# Patient Record
Sex: Female | Born: 1935
Health system: Southern US, Community
[De-identification: ages and names within clinical notes are randomized; demographics above are authoritative.]

## PROBLEM LIST (undated history)

## (undated) DIAGNOSIS — F0390 Unspecified dementia without behavioral disturbance: Secondary | ICD-10-CM

## (undated) DIAGNOSIS — M1990 Unspecified osteoarthritis, unspecified site: Secondary | ICD-10-CM

---

## 1998-11-24 ENCOUNTER — Other Ambulatory Visit: Admission: RE | Admit: 1998-11-24 | Discharge: 1998-11-24 | Payer: Self-pay | Admitting: Gastroenterology

## 1999-07-31 ENCOUNTER — Encounter: Payer: Self-pay | Admitting: Family Medicine

## 1999-07-31 ENCOUNTER — Encounter: Admission: RE | Admit: 1999-07-31 | Discharge: 1999-07-31 | Payer: Self-pay | Admitting: Family Medicine

## 2000-01-02 ENCOUNTER — Encounter: Payer: Self-pay | Admitting: Sports Medicine

## 2000-01-02 ENCOUNTER — Encounter: Admission: RE | Admit: 2000-01-02 | Discharge: 2000-01-02 | Payer: Self-pay | Admitting: Sports Medicine

## 2000-01-09 ENCOUNTER — Other Ambulatory Visit: Admission: RE | Admit: 2000-01-09 | Discharge: 2000-01-09 | Payer: Self-pay | Admitting: Family Medicine

## 2000-01-16 ENCOUNTER — Encounter: Payer: Self-pay | Admitting: Family Medicine

## 2000-01-16 ENCOUNTER — Encounter: Admission: RE | Admit: 2000-01-16 | Discharge: 2000-01-16 | Payer: Self-pay | Admitting: Family Medicine

## 2001-01-02 ENCOUNTER — Encounter: Admission: RE | Admit: 2001-01-02 | Discharge: 2001-01-02 | Payer: Self-pay | Admitting: Family Medicine

## 2001-01-02 ENCOUNTER — Encounter: Payer: Self-pay | Admitting: Family Medicine

## 2001-12-31 ENCOUNTER — Encounter: Payer: Self-pay | Admitting: Family Medicine

## 2001-12-31 ENCOUNTER — Encounter: Admission: RE | Admit: 2001-12-31 | Discharge: 2001-12-31 | Payer: Self-pay | Admitting: Family Medicine

## 2002-11-26 ENCOUNTER — Ambulatory Visit (HOSPITAL_COMMUNITY): Admission: RE | Admit: 2002-11-26 | Discharge: 2002-11-26 | Payer: Self-pay | Admitting: Gastroenterology

## 2002-11-26 ENCOUNTER — Encounter: Payer: Self-pay | Admitting: Gastroenterology

## 2003-01-01 ENCOUNTER — Encounter: Admission: RE | Admit: 2003-01-01 | Discharge: 2003-01-01 | Payer: Self-pay | Admitting: Family Medicine

## 2003-01-01 ENCOUNTER — Encounter: Payer: Self-pay | Admitting: Family Medicine

## 2003-02-19 ENCOUNTER — Ambulatory Visit (HOSPITAL_COMMUNITY): Admission: RE | Admit: 2003-02-19 | Discharge: 2003-02-19 | Payer: Self-pay | Admitting: Gastroenterology

## 2003-02-19 ENCOUNTER — Encounter: Payer: Self-pay | Admitting: Gastroenterology

## 2004-01-06 ENCOUNTER — Encounter: Admission: RE | Admit: 2004-01-06 | Discharge: 2004-01-06 | Payer: Self-pay | Admitting: Family Medicine

## 2004-07-13 ENCOUNTER — Encounter: Admission: RE | Admit: 2004-07-13 | Discharge: 2004-07-13 | Payer: Self-pay | Admitting: Family Medicine

## 2005-01-08 ENCOUNTER — Encounter: Admission: RE | Admit: 2005-01-08 | Discharge: 2005-01-08 | Payer: Self-pay | Admitting: Family Medicine

## 2005-05-08 ENCOUNTER — Encounter: Admission: RE | Admit: 2005-05-08 | Discharge: 2005-05-08 | Payer: Self-pay | Admitting: Family Medicine

## 2005-06-18 ENCOUNTER — Encounter: Admission: RE | Admit: 2005-06-18 | Discharge: 2005-06-18 | Payer: Self-pay | Admitting: Otolaryngology

## 2006-01-16 ENCOUNTER — Encounter: Admission: RE | Admit: 2006-01-16 | Discharge: 2006-01-16 | Payer: Self-pay | Admitting: Family Medicine

## 2006-07-09 ENCOUNTER — Encounter: Admission: RE | Admit: 2006-07-09 | Discharge: 2006-07-09 | Payer: Self-pay | Admitting: Family Medicine

## 2007-01-20 ENCOUNTER — Encounter: Admission: RE | Admit: 2007-01-20 | Discharge: 2007-01-20 | Payer: Self-pay | Admitting: Family Medicine

## 2007-06-25 ENCOUNTER — Encounter: Admission: RE | Admit: 2007-06-25 | Discharge: 2007-06-25 | Payer: Self-pay | Admitting: Family Medicine

## 2008-02-10 ENCOUNTER — Encounter: Admission: RE | Admit: 2008-02-10 | Discharge: 2008-02-10 | Payer: Self-pay | Admitting: Family Medicine

## 2008-03-22 ENCOUNTER — Encounter: Admission: RE | Admit: 2008-03-22 | Discharge: 2008-03-22 | Payer: Self-pay | Admitting: Otolaryngology

## 2009-02-10 ENCOUNTER — Encounter: Admission: RE | Admit: 2009-02-10 | Discharge: 2009-02-10 | Payer: Self-pay | Admitting: Family Medicine

## 2009-06-28 ENCOUNTER — Encounter (INDEPENDENT_AMBULATORY_CARE_PROVIDER_SITE_OTHER): Payer: Self-pay | Admitting: *Deleted

## 2009-07-26 ENCOUNTER — Encounter: Admission: RE | Admit: 2009-07-26 | Discharge: 2009-07-26 | Payer: Self-pay | Admitting: Family Medicine

## 2010-02-24 ENCOUNTER — Telehealth: Payer: Self-pay | Admitting: Internal Medicine

## 2010-05-16 ENCOUNTER — Telehealth: Payer: Self-pay | Admitting: Internal Medicine

## 2010-06-22 ENCOUNTER — Encounter (INDEPENDENT_AMBULATORY_CARE_PROVIDER_SITE_OTHER): Payer: Self-pay | Admitting: *Deleted

## 2010-06-26 ENCOUNTER — Ambulatory Visit: Payer: Self-pay | Admitting: Internal Medicine

## 2010-07-11 ENCOUNTER — Ambulatory Visit: Payer: Self-pay | Admitting: Internal Medicine

## 2010-10-29 ENCOUNTER — Encounter: Payer: Self-pay | Admitting: Family Medicine

## 2010-10-30 ENCOUNTER — Encounter: Payer: Self-pay | Admitting: Family Medicine

## 2010-11-09 NOTE — Procedures (Signed)
Summary: Colonoscopy  Patient: Brandi Chang Note: All result statuses are Final unless otherwise noted.  Tests: (1) Colonoscopy (COL)   COL Colonoscopy           DONE     [facility]     [address]     [place], [place]  [address]           COLONOSCOPY PROCEDURE REPORT           PATIENT:  Brandi Chang, Brandi Chang  MR#:  [id number]     BIRTHDATE:  1930/04/01, 79 yrs. old  GENDER:  female     ENDOSCOPIST:  Hedwig Morton. Juanda Chance, MD     REF. BY:  Burnell Blanks, M.D.     PROCEDURE DATE:  07/11/2010     PROCEDURE:  Colonoscopy 19147     ASA CLASS:  Class II     INDICATIONS:  Elevated Risk Screening hx colon polyps (?), no     documentation     last colon 2005 no polyps     MEDICATIONS:   Versed 8 mg, Fentanyl 62.5 mcg           DESCRIPTION OF PROCEDURE:   After the risks benefits and     alternatives of the procedure were thoroughly explained, informed     consent was obtained.  Digital rectal exam was performed and     revealed no rectal masses.   The LB PCF-H180AL [id number] endoscope     was introduced through the anus and advanced to the cecum, which     was identified by both the appendix and ileocecal valve, without     limitations.  The quality of the prep was good, using MiraLax.     The instrument was then slowly withdrawn as the colon was fully     examined.     <<PROCEDUREIMAGES>>           FINDINGS:  No polyps or cancers were seen (see image1, image2,     image3, and image4).   Retroflexed views in the rectum revealed no     abnormalities.    The scope was then withdrawn from the patient     and the procedure completed.           COMPLICATIONS:  None     ENDOSCOPIC IMPRESSION:     1) No polyps or cancers     2) Normal colonoscopy     RECOMMENDATIONS:     1) high fiber diet     REPEAT EXAM:  In 0 year(s) for.  no recall due to age           ______________________________     Hedwig Morton. Juanda Chance, MD           CC:           n.     eSIGNED:   Hedwig Morton. Tristyn Pharris at  07/11/2010 10:23 AM           Lucretia Roers, Marise, [phone]  Note: An exclamation mark (!) indicates a result that was not dispersed into the flowsheet. Document Creation Date: 07/11/2010 10:24 AM _______________________________________________________________________  (1) Order result status: Final Collection or observation date-time: 07/11/2010 10:11 Requested date-time:  Receipt date-time:  Reported date-time:  Referring Physician:   Ordering Physician: Lina Sar [phone]) Specimen Source:  Source: Launa Grill Order Number: [id number] Lab site:

## 2010-11-09 NOTE — Progress Notes (Signed)
Summary: mucus in stool  Phone Note Call from Patient Call back at Home Phone [phone]   Caller: Patient Call For: Dr. Juanda Chance Reason for Call: Talk to Nurse Summary of Call: pt reporting mucus in stool and is afraid to wait until next available appt in October... wants to know if that would be ok Initial call taken by: Vallarie Mare,  May 16, 2010 10:28 AM  Follow-up for Phone Call        Pt asking to sch colon.  Stated she was called recently and notified that it was time to sch colon.  She is ready to sch, has noticed some mucus in her stool, no other symptoms.  Colon and previsit sch.  Pt instructed to call if bleeding or other symptoms develop. Follow-up by: Ashok Cordia RN,  May 16, 2010 12:09 PM

## 2010-11-09 NOTE — Letter (Signed)
Summary: [facility] Instructions  [facility]  [address] [place], [place] [address]   Phone: [phone]  Fax: [phone]       Brandi Chang    1930/04/14    MRN: [id number]       Procedure Day /Date:  Tuesday 07/11/2010     Arrival Time: 8:30 am     Procedure Time: 9:30 am     Location of Procedure:                    _ x_  [facility] (4th Floor)   PREPARATION FOR COLONOSCOPY WITH MIRALAX  Starting 5 days prior to your procedure Thursday 9/29 do not eat nuts, seeds, popcorn, corn, beans, peas,  salads, or any raw vegetables.  Do not take any fiber supplements (e.g. Metamucil, Citrucel, and Benefiber). ____________________________________________________________________________________________________   THE DAY BEFORE YOUR PROCEDURE         DATE: Monday 10/3  1   Drink clear liquids the entire day-NO SOLID FOOD  2   Do not drink anything colored red or purple.  Avoid juices with pulp.  No orange juice.  3   Drink at least 64 oz. (8 glasses) of fluid/clear liquids during the day to prevent dehydration and help the prep work efficiently.  CLEAR LIQUIDS INCLUDE: Water Jello Ice Popsicles Tea (sugar ok, no milk/cream) Powdered fruit flavored drinks Coffee (sugar ok, no milk/cream) Gatorade Juice: apple, white grape, white cranberry  Lemonade Clear bullion, consomm, broth Carbonated beverages (any kind) Strained chicken noodle soup Hard Candy  4   Mix the entire bottle of Miralax with 64 oz. of Gatorade/Powerade in the morning and put in the refrigerator to chill.  5   At 3:00 pm take 2 Dulcolax/Bisacodyl tablets.  6   At 4:30 pm take one Reglan/Metoclopramide tablet.  7  Starting at 5:00 pm drink one 8 oz glass of the Miralax mixture every 15-20 minutes until you have finished drinking the entire 64 oz.  You should finish drinking prep around 7:30 or 8:00 pm.  8   If you are nauseated, you may take the 2nd Reglan/Metoclopramide  tablet at 6:30 pm.        9    At 8:00 pm take 2 more DULCOLAX/Bisacodyl tablets.     THE DAY OF YOUR PROCEDURE      DATE:  Tuesday 10/4  You may drink clear liquids until 7:30 am (2 HOURS BEFORE PROCEDURE).   MEDICATION INSTRUCTIONS  Unless otherwise instructed, you should take regular prescription medications with a small sip of water as early as possible the morning of your procedure.    Additional medication instructions: n/a         OTHER INSTRUCTIONS  You will need a responsible adult at least 75 years of age to accompany you and drive you home.   This person must remain in the waiting room during your procedure.  Wear loose fitting clothing that is easily removed.  Leave jewelry and other valuables at home.  However, you may wish to bring a book to read or an iPod/MP3 player to listen to music as you wait for your procedure to start.  Remove all body piercing jewelry and leave at home.  Total time from sign-in until discharge is approximately 2-3 hours.  You should go home directly after your procedure and rest.  You can resume normal activities the day after your procedure.  The day of your procedure you should not:  Drive   Make legal decisions   Operate machinery   Drink alcohol   Return to work  You will receive specific instructions about eating, activities and medications before you leave.   The above instructions have been reviewed and explained to me by   Sherren Kerns RN  June 26, 2010 8:15 AM     I fully understand and can verbalize these instructions _____________________________ Date _______

## 2010-11-09 NOTE — Miscellaneous (Signed)
Summary: previsit prep/RM  Clinical Lists Changes  Medications: Added new medication of MIRALAX   POWD (POLYETHYLENE GLYCOL 3350) As per prep  instructions. - Signed Added new medication of DULCOLAX 5 MG  TBEC (BISACODYL) Day before procedure take 2 at 3pm and 2 at 8pm. - Signed Added new medication of REGLAN 10 MG  TABS (METOCLOPRAMIDE HCL) As per prep instructions. - Signed Rx of MIRALAX   POWD (POLYETHYLENE GLYCOL 3350) As per prep  instructions.;  #255gm x 0;  Signed;  Entered by: Sherren Kerns RN;  Authorized by: Hart Carwin MD;  Method used: Print then Give to Patient Rx of DULCOLAX 5 MG  TBEC (BISACODYL) Day before procedure take 2 at 3pm and 2 at 8pm.;  #4 x 0;  Signed;  Entered by: Sherren Kerns RN;  Authorized by: Hart Carwin MD;  Method used: Print then Give to Patient Rx of REGLAN 10 MG  TABS (METOCLOPRAMIDE HCL) As per prep instructions.;  #2 x 0;  Signed;  Entered by: Sherren Kerns RN;  Authorized by: Hart Carwin MD;  Method used: Print then Give to Patient    Prescriptions: REGLAN 10 MG  TABS (METOCLOPRAMIDE HCL) As per prep instructions.  #2 x 0   Entered by:   Sherren Kerns RN   Authorized by:   Hart Carwin MD   Signed by:   Sherren Kerns RN on 06/26/2010   Method used:   Print then Give to Patient   RxID:   [id number] DULCOLAX 5 MG  TBEC (BISACODYL) Day before procedure take 2 at 3pm and 2 at 8pm.  #4 x 0   Entered by:   Sherren Kerns RN   Authorized by:   Hart Carwin MD   Signed by:   Sherren Kerns RN on 06/26/2010   Method used:   Print then Give to Patient   RxID:   [id number] MIRALAX   POWD (POLYETHYLENE GLYCOL 3350) As per prep  instructions.  #255gm x 0   Entered by:   Sherren Kerns RN   Authorized by:   Hart Carwin MD   Signed by:   Sherren Kerns RN on 06/26/2010   Method used:   Print then Give to Patient   RxID:   [id number]

## 2010-11-09 NOTE — Progress Notes (Signed)
Summary: Schedule Colonoscopy  Phone Note Outgoing Call Call back at Home Phone [phone]   Call placed by: Harlow Mares CMA Duncan Dull),  Feb 24, 2010 4:45 PM Call placed to: Patient Summary of Call: spoke to the patient she has alot going on now and would like to know if she could not have her colonosocpy, i strongly her that she would need to have her colonoscopy. She has a person hx of adenmatous polyps. i gave her the number and the MD name. She will think about it and call back.  Initial call taken by: Harlow Mares CMA Duncan Dull),  Feb 24, 2010 4:59 PM

## 2011-07-23 ENCOUNTER — Other Ambulatory Visit: Payer: Self-pay | Admitting: Family Medicine

## 2011-07-23 DIAGNOSIS — M8580 Other specified disorders of bone density and structure, unspecified site: Secondary | ICD-10-CM

## 2011-07-23 DIAGNOSIS — Z1231 Encounter for screening mammogram for malignant neoplasm of breast: Secondary | ICD-10-CM

## 2011-08-17 ENCOUNTER — Ambulatory Visit: Payer: Self-pay

## 2011-08-17 ENCOUNTER — Other Ambulatory Visit: Payer: Self-pay

## 2011-08-27 ENCOUNTER — Other Ambulatory Visit: Payer: Self-pay

## 2011-08-27 ENCOUNTER — Ambulatory Visit: Payer: Self-pay

## 2011-08-29 ENCOUNTER — Other Ambulatory Visit: Payer: Self-pay

## 2011-08-29 ENCOUNTER — Ambulatory Visit: Payer: Self-pay

## 2012-03-11 DIAGNOSIS — M159 Polyosteoarthritis, unspecified: Secondary | ICD-10-CM | POA: Diagnosis not present

## 2012-03-11 DIAGNOSIS — M899 Disorder of bone, unspecified: Secondary | ICD-10-CM | POA: Diagnosis not present

## 2012-03-11 DIAGNOSIS — G3184 Mild cognitive impairment, so stated: Secondary | ICD-10-CM | POA: Diagnosis not present

## 2012-03-11 DIAGNOSIS — M949 Disorder of cartilage, unspecified: Secondary | ICD-10-CM | POA: Diagnosis not present

## 2012-04-04 ENCOUNTER — Other Ambulatory Visit: Payer: Self-pay

## 2012-04-04 ENCOUNTER — Ambulatory Visit: Payer: Self-pay

## 2012-04-17 DIAGNOSIS — Z23 Encounter for immunization: Secondary | ICD-10-CM | POA: Diagnosis not present

## 2012-04-23 ENCOUNTER — Ambulatory Visit: Payer: Self-pay

## 2012-04-23 ENCOUNTER — Other Ambulatory Visit: Payer: Self-pay

## 2012-05-23 ENCOUNTER — Ambulatory Visit
Admission: RE | Admit: 2012-05-23 | Discharge: 2012-05-23 | Disposition: A | Discharge status: Home / Self Care | Payer: Medicare Other | Source: Ambulatory Visit | Attending: Family Medicine | Admitting: Family Medicine

## 2012-05-23 ENCOUNTER — Ambulatory Visit: Payer: Self-pay

## 2012-05-23 ENCOUNTER — Ambulatory Visit
Admission: RE | Admit: 2012-05-23 | Discharge: 2012-05-23 | Disposition: A | Discharge status: Home / Self Care | Payer: Self-pay | Source: Ambulatory Visit | Attending: Family Medicine | Admitting: Family Medicine

## 2012-05-23 ENCOUNTER — Other Ambulatory Visit: Payer: Self-pay

## 2012-05-23 DIAGNOSIS — M949 Disorder of cartilage, unspecified: Secondary | ICD-10-CM | POA: Diagnosis not present

## 2012-05-23 DIAGNOSIS — Z780 Asymptomatic menopausal state: Secondary | ICD-10-CM | POA: Diagnosis not present

## 2012-05-23 DIAGNOSIS — Z1231 Encounter for screening mammogram for malignant neoplasm of breast: Secondary | ICD-10-CM | POA: Diagnosis not present

## 2012-05-23 DIAGNOSIS — M8580 Other specified disorders of bone density and structure, unspecified site: Secondary | ICD-10-CM

## 2012-05-23 DIAGNOSIS — M899 Disorder of bone, unspecified: Secondary | ICD-10-CM | POA: Diagnosis not present

## 2012-06-26 DIAGNOSIS — Z23 Encounter for immunization: Secondary | ICD-10-CM | POA: Diagnosis not present

## 2012-06-26 DIAGNOSIS — R413 Other amnesia: Secondary | ICD-10-CM | POA: Diagnosis not present

## 2012-06-26 DIAGNOSIS — M899 Disorder of bone, unspecified: Secondary | ICD-10-CM | POA: Diagnosis not present

## 2012-07-25 DIAGNOSIS — M899 Disorder of bone, unspecified: Secondary | ICD-10-CM | POA: Diagnosis not present

## 2012-07-25 DIAGNOSIS — F0390 Unspecified dementia without behavioral disturbance: Secondary | ICD-10-CM | POA: Diagnosis not present

## 2012-07-25 DIAGNOSIS — M545 Low back pain: Secondary | ICD-10-CM | POA: Diagnosis not present

## 2012-07-25 DIAGNOSIS — IMO0002 Reserved for concepts with insufficient information to code with codable children: Secondary | ICD-10-CM | POA: Diagnosis not present

## 2012-07-28 DIAGNOSIS — H2510 Age-related nuclear cataract, unspecified eye: Secondary | ICD-10-CM | POA: Diagnosis not present

## 2012-08-08 DIAGNOSIS — M47817 Spondylosis without myelopathy or radiculopathy, lumbosacral region: Secondary | ICD-10-CM | POA: Diagnosis not present

## 2012-08-08 DIAGNOSIS — M545 Low back pain, unspecified: Secondary | ICD-10-CM | POA: Diagnosis not present

## 2012-08-21 ENCOUNTER — Ambulatory Visit: Payer: Self-pay | Admitting: Ophthalmology

## 2012-08-21 DIAGNOSIS — H2510 Age-related nuclear cataract, unspecified eye: Secondary | ICD-10-CM | POA: Diagnosis not present

## 2012-08-21 DIAGNOSIS — Z01810 Encounter for preprocedural cardiovascular examination: Secondary | ICD-10-CM | POA: Diagnosis not present

## 2012-08-21 DIAGNOSIS — I10 Essential (primary) hypertension: Secondary | ICD-10-CM | POA: Diagnosis not present

## 2012-09-02 ENCOUNTER — Ambulatory Visit: Payer: Self-pay | Admitting: Ophthalmology

## 2012-09-02 DIAGNOSIS — M129 Arthropathy, unspecified: Secondary | ICD-10-CM | POA: Diagnosis not present

## 2012-09-02 DIAGNOSIS — M810 Age-related osteoporosis without current pathological fracture: Secondary | ICD-10-CM | POA: Diagnosis not present

## 2012-09-02 DIAGNOSIS — K219 Gastro-esophageal reflux disease without esophagitis: Secondary | ICD-10-CM | POA: Diagnosis not present

## 2012-09-02 DIAGNOSIS — H2510 Age-related nuclear cataract, unspecified eye: Secondary | ICD-10-CM | POA: Diagnosis not present

## 2012-09-02 DIAGNOSIS — H269 Unspecified cataract: Secondary | ICD-10-CM | POA: Diagnosis not present

## 2012-09-02 DIAGNOSIS — F329 Major depressive disorder, single episode, unspecified: Secondary | ICD-10-CM | POA: Diagnosis not present

## 2012-09-02 DIAGNOSIS — Z79899 Other long term (current) drug therapy: Secondary | ICD-10-CM | POA: Diagnosis not present

## 2012-09-02 DIAGNOSIS — R413 Other amnesia: Secondary | ICD-10-CM | POA: Diagnosis not present

## 2012-09-12 DIAGNOSIS — F0390 Unspecified dementia without behavioral disturbance: Secondary | ICD-10-CM | POA: Diagnosis not present

## 2012-09-12 DIAGNOSIS — M1990 Unspecified osteoarthritis, unspecified site: Secondary | ICD-10-CM | POA: Diagnosis not present

## 2012-09-12 DIAGNOSIS — M545 Low back pain: Secondary | ICD-10-CM | POA: Diagnosis not present

## 2012-09-15 DIAGNOSIS — Z79899 Other long term (current) drug therapy: Secondary | ICD-10-CM | POA: Diagnosis not present

## 2012-09-15 DIAGNOSIS — E559 Vitamin D deficiency, unspecified: Secondary | ICD-10-CM | POA: Diagnosis not present

## 2013-01-15 DIAGNOSIS — Z1331 Encounter for screening for depression: Secondary | ICD-10-CM | POA: Diagnosis not present

## 2013-01-15 DIAGNOSIS — F0390 Unspecified dementia without behavioral disturbance: Secondary | ICD-10-CM | POA: Diagnosis not present

## 2013-01-15 DIAGNOSIS — E559 Vitamin D deficiency, unspecified: Secondary | ICD-10-CM | POA: Diagnosis not present

## 2013-01-15 DIAGNOSIS — Z79899 Other long term (current) drug therapy: Secondary | ICD-10-CM | POA: Diagnosis not present

## 2013-01-15 DIAGNOSIS — IMO0002 Reserved for concepts with insufficient information to code with codable children: Secondary | ICD-10-CM | POA: Diagnosis not present

## 2013-01-15 DIAGNOSIS — Z9181 History of falling: Secondary | ICD-10-CM | POA: Diagnosis not present

## 2013-02-11 DIAGNOSIS — M6740 Ganglion, unspecified site: Secondary | ICD-10-CM | POA: Diagnosis not present

## 2013-02-11 DIAGNOSIS — IMO0002 Reserved for concepts with insufficient information to code with codable children: Secondary | ICD-10-CM | POA: Diagnosis not present

## 2013-02-11 DIAGNOSIS — Z9181 History of falling: Secondary | ICD-10-CM | POA: Diagnosis not present

## 2013-04-08 DIAGNOSIS — L723 Sebaceous cyst: Secondary | ICD-10-CM | POA: Diagnosis not present

## 2013-04-08 DIAGNOSIS — M7130 Other bursal cyst, unspecified site: Secondary | ICD-10-CM | POA: Diagnosis not present

## 2013-06-24 DIAGNOSIS — H353190 Nonexudative age-related macular degeneration, unspecified eye, stage unspecified: Secondary | ICD-10-CM | POA: Diagnosis not present

## 2013-07-21 ENCOUNTER — Other Ambulatory Visit: Payer: Self-pay

## 2013-07-21 DIAGNOSIS — Z1231 Encounter for screening mammogram for malignant neoplasm of breast: Secondary | ICD-10-CM

## 2013-07-23 DIAGNOSIS — E559 Vitamin D deficiency, unspecified: Secondary | ICD-10-CM | POA: Diagnosis not present

## 2013-07-23 DIAGNOSIS — Z23 Encounter for immunization: Secondary | ICD-10-CM | POA: Diagnosis not present

## 2013-07-23 DIAGNOSIS — M899 Disorder of bone, unspecified: Secondary | ICD-10-CM | POA: Diagnosis not present

## 2013-07-23 DIAGNOSIS — Z79899 Other long term (current) drug therapy: Secondary | ICD-10-CM | POA: Diagnosis not present

## 2013-07-23 DIAGNOSIS — M159 Polyosteoarthritis, unspecified: Secondary | ICD-10-CM | POA: Diagnosis not present

## 2013-07-24 DIAGNOSIS — H25049 Posterior subcapsular polar age-related cataract, unspecified eye: Secondary | ICD-10-CM | POA: Diagnosis not present

## 2013-08-06 ENCOUNTER — Ambulatory Visit: Payer: Self-pay | Admitting: Ophthalmology

## 2013-08-06 DIAGNOSIS — H2510 Age-related nuclear cataract, unspecified eye: Secondary | ICD-10-CM | POA: Diagnosis not present

## 2013-08-06 DIAGNOSIS — I10 Essential (primary) hypertension: Secondary | ICD-10-CM | POA: Diagnosis not present

## 2013-08-06 DIAGNOSIS — Z01810 Encounter for preprocedural cardiovascular examination: Secondary | ICD-10-CM | POA: Diagnosis not present

## 2013-08-19 ENCOUNTER — Ambulatory Visit: Payer: Medicare Other

## 2013-08-19 ENCOUNTER — Ambulatory Visit: Payer: Self-pay | Admitting: Ophthalmology

## 2013-08-19 DIAGNOSIS — K219 Gastro-esophageal reflux disease without esophagitis: Secondary | ICD-10-CM | POA: Diagnosis not present

## 2013-08-19 DIAGNOSIS — H2510 Age-related nuclear cataract, unspecified eye: Secondary | ICD-10-CM | POA: Diagnosis not present

## 2013-08-19 DIAGNOSIS — H259 Unspecified age-related cataract: Secondary | ICD-10-CM | POA: Diagnosis not present

## 2013-08-19 DIAGNOSIS — M810 Age-related osteoporosis without current pathological fracture: Secondary | ICD-10-CM | POA: Diagnosis not present

## 2013-08-19 DIAGNOSIS — Z79899 Other long term (current) drug therapy: Secondary | ICD-10-CM | POA: Diagnosis not present

## 2013-08-19 DIAGNOSIS — H269 Unspecified cataract: Secondary | ICD-10-CM | POA: Diagnosis not present

## 2013-08-19 DIAGNOSIS — K449 Diaphragmatic hernia without obstruction or gangrene: Secondary | ICD-10-CM | POA: Diagnosis not present

## 2013-08-19 DIAGNOSIS — M129 Arthropathy, unspecified: Secondary | ICD-10-CM | POA: Diagnosis not present

## 2013-08-31 ENCOUNTER — Ambulatory Visit
Admission: RE | Admit: 2013-08-31 | Discharge: 2013-08-31 | Disposition: A | Discharge status: Home / Self Care | Payer: Medicare Other | Source: Ambulatory Visit

## 2013-08-31 DIAGNOSIS — Z1231 Encounter for screening mammogram for malignant neoplasm of breast: Secondary | ICD-10-CM

## 2013-11-04 DIAGNOSIS — M899 Disorder of bone, unspecified: Secondary | ICD-10-CM | POA: Diagnosis not present

## 2013-11-04 DIAGNOSIS — M949 Disorder of cartilage, unspecified: Secondary | ICD-10-CM | POA: Diagnosis not present

## 2014-01-25 DIAGNOSIS — M159 Polyosteoarthritis, unspecified: Secondary | ICD-10-CM | POA: Diagnosis not present

## 2014-01-25 DIAGNOSIS — Z23 Encounter for immunization: Secondary | ICD-10-CM | POA: Diagnosis not present

## 2014-01-25 DIAGNOSIS — Z1331 Encounter for screening for depression: Secondary | ICD-10-CM | POA: Diagnosis not present

## 2014-01-25 DIAGNOSIS — IMO0002 Reserved for concepts with insufficient information to code with codable children: Secondary | ICD-10-CM | POA: Diagnosis not present

## 2014-01-25 DIAGNOSIS — M949 Disorder of cartilage, unspecified: Secondary | ICD-10-CM | POA: Diagnosis not present

## 2014-01-25 DIAGNOSIS — M899 Disorder of bone, unspecified: Secondary | ICD-10-CM | POA: Diagnosis not present

## 2014-01-25 DIAGNOSIS — E559 Vitamin D deficiency, unspecified: Secondary | ICD-10-CM | POA: Diagnosis not present

## 2014-01-25 DIAGNOSIS — Z79899 Other long term (current) drug therapy: Secondary | ICD-10-CM | POA: Diagnosis not present

## 2014-01-25 DIAGNOSIS — Z9181 History of falling: Secondary | ICD-10-CM | POA: Diagnosis not present

## 2014-03-26 DIAGNOSIS — Z961 Presence of intraocular lens: Secondary | ICD-10-CM | POA: Diagnosis not present

## 2014-03-26 DIAGNOSIS — H2510 Age-related nuclear cataract, unspecified eye: Secondary | ICD-10-CM | POA: Diagnosis not present

## 2014-05-11 DIAGNOSIS — M949 Disorder of cartilage, unspecified: Secondary | ICD-10-CM | POA: Diagnosis not present

## 2014-05-11 DIAGNOSIS — M899 Disorder of bone, unspecified: Secondary | ICD-10-CM | POA: Diagnosis not present

## 2014-07-27 DIAGNOSIS — F0390 Unspecified dementia without behavioral disturbance: Secondary | ICD-10-CM | POA: Diagnosis not present

## 2014-07-27 DIAGNOSIS — Z79899 Other long term (current) drug therapy: Secondary | ICD-10-CM | POA: Diagnosis not present

## 2014-07-27 DIAGNOSIS — Z23 Encounter for immunization: Secondary | ICD-10-CM | POA: Diagnosis not present

## 2014-07-27 DIAGNOSIS — E559 Vitamin D deficiency, unspecified: Secondary | ICD-10-CM | POA: Diagnosis not present

## 2014-07-27 DIAGNOSIS — M159 Polyosteoarthritis, unspecified: Secondary | ICD-10-CM | POA: Diagnosis not present

## 2014-07-29 ENCOUNTER — Other Ambulatory Visit: Payer: Self-pay

## 2014-07-29 DIAGNOSIS — Z1231 Encounter for screening mammogram for malignant neoplasm of breast: Secondary | ICD-10-CM

## 2014-08-04 ENCOUNTER — Other Ambulatory Visit: Payer: Self-pay | Admitting: Family Medicine

## 2014-08-04 DIAGNOSIS — M8580 Other specified disorders of bone density and structure, unspecified site: Secondary | ICD-10-CM

## 2014-08-13 DIAGNOSIS — L84 Corns and callosities: Secondary | ICD-10-CM | POA: Diagnosis not present

## 2014-08-13 DIAGNOSIS — R413 Other amnesia: Secondary | ICD-10-CM | POA: Diagnosis not present

## 2014-09-01 ENCOUNTER — Other Ambulatory Visit: Payer: Medicare Other

## 2014-09-01 ENCOUNTER — Ambulatory Visit: Payer: Medicare Other

## 2014-09-27 ENCOUNTER — Ambulatory Visit: Payer: Medicare Other

## 2014-09-27 ENCOUNTER — Other Ambulatory Visit: Payer: Medicare Other

## 2015-01-18 ENCOUNTER — Other Ambulatory Visit: Payer: Medicare Other

## 2015-01-18 ENCOUNTER — Ambulatory Visit: Payer: Medicare Other

## 2015-01-25 NOTE — Op Note (Signed)
PATIENT NAME:  Brandi Chang, Brandi Chang MR#:  [id number] DATE OF BIRTH:  1930-06-28  DATE OF PROCEDURE:  09/02/2012  PREOPERATIVE DIAGNOSIS: Visually significant cataract of the left eye.   POSTOPERATIVE DIAGNOSIS: Visually significant cataract of the left eye.   OPERATIVE PROCEDURE: Cataract extraction by phacoemulsification with implant of intraocular lens to left eye.   SURGEON: Galen ManilaWilliam Aftan Vint, MD.   ANESTHESIA:  1. Managed anesthesia care.  2. Topical tetracaine drops followed by 2% Xylocaine jelly applied in the preoperative holding area.   COMPLICATIONS: None.   TECHNIQUE:  Stop and chop.   DESCRIPTION OF PROCEDURE: The patient was examined and consented in the preoperative holding area where the aforementioned topical anesthesia was applied to the left eye and then brought back to the Operating Room where the left eye was prepped and draped in the usual sterile ophthalmic fashion and a lid speculum was placed. A paracentesis was created with the side port blade and the anterior chamber was filled with viscoelastic. A near clear corneal incision was performed with the steel keratome. A continuous curvilinear capsulorrhexis was performed with a cystotome followed by the capsulorrhexis forceps. Hydrodissection and hydrodelineation were carried out with BSS on a blunt cannula. The lens was removed in a stop and chop technique and the remaining cortical material was removed with the irrigation-aspiration handpiece. The capsular bag was inflated with viscoelastic and the Tecnis ZCB00 22.5-diopter lens, serial number [id number] was placed in the capsular bag without complication. The remaining viscoelastic was removed from the eye with the irrigation-aspiration handpiece. The wounds were hydrated. The anterior chamber was flushed with Miostat and the eye was inflated to physiologic pressure. The wounds were found to be water tight. The eye was dressed with Vigamox. The patient was given protective  glasses to wear throughout the day and a shield with which to sleep tonight. The patient was also given drops with which to begin a drop regimen today and will follow-up with me in one day.  ____________________________ Jerilee FieldWilliam L. Avyay Coger, MD wlp:slb D: 09/02/2012 16:35:16 ET T: 09/02/2012 16:55:08 ET JOB#: [id number]  cc: Graylin Sperling L. Trenesha Alcaide, MD, <Dictator> Jerilee FieldWILLIAM L Aiden Helzer MD ELECTRONICALLY SIGNED 09/11/2012 15:45

## 2015-01-28 NOTE — Op Note (Signed)
PATIENT NAME:  Brandi Chang, Brandi Chang MR#:  [id number] DATE OF BIRTH:  September 15, 1930  DATE OF PROCEDURE:  08/19/2013  PREOPERATIVE DIAGNOSIS:  Senile cataract right eye.  POSTOPERATIVE DIAGNOSIS:  Senile cataract right eye.  PROCEDURE:  Phacoemulsification with posterior chamber intraocular lens implantation of the right eye.  LENS:  ZCB00 23.0-diopter posterior chamber intraocular lens.  ULTRASOUND TIME:  14% of 1 minute, 45 seconds.  CDE 14.6.   SURGEON:  Italyhad Rihaan Barrack, MD  ANESTHESIA:  Topical with tetracaine drops and 2% Xylocaine jelly.  COMPLICATIONS:  None.  DESCRIPTION OF PROCEDURE:  The patient was identified in the holding room and transported to the operating room and placed in the supine position under the operating microscope.  The right eye was identified as the operative eye and it was prepped and draped in the usual sterile ophthalmic fashion.  A 1 millimeter clear-corneal paracentesis was made at the 12:00 position.  The anterior chamber was filled with Viscoat viscoelastic.  A 2.4 millimeter keratome was used to make a near-clear corneal incision at the 9:00 position.  A curvilinear capsulorrhexis was made with a cystotome and capsulorrhexis forceps.  Balanced salt solution was used to hydrodissect and hydrodelineate the nucleus.  Phacoemulsification was then used in stop and chop fashion to remove the lens nucleus and epinucleus.  The remaining cortex was then removed using the irrigation and aspiration handpiece. Provisc was then placed into the capsular bag to distend it for lens placement.  A ZCB00 23.0-diopter lens was then injected into the capsular bag.  The remaining viscoelastic was aspirated.  Wounds were hydrated with balanced salt solution.  The anterior chamber was inflated to a physiologic pressure with balanced salt solution.  0.1 mL of cefuroxime 10 mg/mL were injected into the anterior chamber for a dose of 1 mg of intracameral antibiotic at the completion of the  case. Miostat was placed into the anterior chamber to constrict the pupil.  No wound leaks were noted.  Topical Vigamox drops and Maxitrol ointment were applied to the eye.  The patient was taken to the recovery room in stable condition without complications of anesthesia or surgery.   ____________________________ Deirdre Evenerhadwick R. Bubba Vanbenschoten, MD crb:aw D: 08/19/2013 13:44:44 ET T: 08/19/2013 14:35:30 ET JOB#: [id number]  cc: Deirdre Evenerhadwick R. Oluwasemilore Bahl, MD, <Dictator> Lockie MolaHADWICK Keyatta Tolles MD ELECTRONICALLY SIGNED 08/26/2013 12:22

## 2015-02-01 ENCOUNTER — Other Ambulatory Visit: Payer: Self-pay | Admitting: Family Medicine

## 2015-02-01 DIAGNOSIS — F0390 Unspecified dementia without behavioral disturbance: Secondary | ICD-10-CM | POA: Diagnosis not present

## 2015-02-01 DIAGNOSIS — M8588 Other specified disorders of bone density and structure, other site: Secondary | ICD-10-CM | POA: Diagnosis not present

## 2015-02-01 DIAGNOSIS — M8580 Other specified disorders of bone density and structure, unspecified site: Secondary | ICD-10-CM | POA: Diagnosis not present

## 2015-02-01 DIAGNOSIS — Z1231 Encounter for screening mammogram for malignant neoplasm of breast: Secondary | ICD-10-CM | POA: Diagnosis not present

## 2015-02-01 DIAGNOSIS — Z1389 Encounter for screening for other disorder: Secondary | ICD-10-CM | POA: Diagnosis not present

## 2015-02-01 DIAGNOSIS — Z9181 History of falling: Secondary | ICD-10-CM | POA: Diagnosis not present

## 2015-02-01 DIAGNOSIS — M159 Polyosteoarthritis, unspecified: Secondary | ICD-10-CM | POA: Diagnosis not present

## 2015-02-01 DIAGNOSIS — Z79899 Other long term (current) drug therapy: Secondary | ICD-10-CM | POA: Diagnosis not present

## 2015-02-01 DIAGNOSIS — E559 Vitamin D deficiency, unspecified: Secondary | ICD-10-CM | POA: Diagnosis not present

## 2015-02-09 ENCOUNTER — Ambulatory Visit: Payer: Medicare Other

## 2015-02-09 ENCOUNTER — Other Ambulatory Visit: Payer: Medicare Other

## 2015-02-22 ENCOUNTER — Ambulatory Visit
Admission: RE | Admit: 2015-02-22 | Discharge: 2015-02-22 | Disposition: A | Discharge status: Home / Self Care | Payer: Medicare Other | Source: Ambulatory Visit | Attending: Family Medicine | Admitting: Family Medicine

## 2015-02-22 ENCOUNTER — Ambulatory Visit
Admission: RE | Admit: 2015-02-22 | Discharge: 2015-02-22 | Disposition: A | Discharge status: Home / Self Care | Payer: Medicare Other | Source: Ambulatory Visit

## 2015-02-22 DIAGNOSIS — Z1231 Encounter for screening mammogram for malignant neoplasm of breast: Secondary | ICD-10-CM

## 2015-02-22 DIAGNOSIS — M8588 Other specified disorders of bone density and structure, other site: Secondary | ICD-10-CM | POA: Diagnosis not present

## 2015-02-22 DIAGNOSIS — M85852 Other specified disorders of bone density and structure, left thigh: Secondary | ICD-10-CM | POA: Diagnosis not present

## 2015-02-22 DIAGNOSIS — M8580 Other specified disorders of bone density and structure, unspecified site: Secondary | ICD-10-CM

## 2015-02-24 ENCOUNTER — Other Ambulatory Visit: Payer: Self-pay | Admitting: Family Medicine

## 2015-02-24 DIAGNOSIS — R928 Other abnormal and inconclusive findings on diagnostic imaging of breast: Secondary | ICD-10-CM

## 2015-03-01 ENCOUNTER — Other Ambulatory Visit: Payer: Medicare Other

## 2015-03-03 ENCOUNTER — Ambulatory Visit
Admission: RE | Admit: 2015-03-03 | Discharge: 2015-03-03 | Disposition: A | Discharge status: Home / Self Care | Payer: Medicare Other | Source: Ambulatory Visit | Attending: Family Medicine | Admitting: Family Medicine

## 2015-03-03 DIAGNOSIS — R928 Other abnormal and inconclusive findings on diagnostic imaging of breast: Secondary | ICD-10-CM | POA: Diagnosis not present

## 2015-03-09 DIAGNOSIS — M8580 Other specified disorders of bone density and structure, unspecified site: Secondary | ICD-10-CM | POA: Diagnosis not present

## 2015-03-10 ENCOUNTER — Other Ambulatory Visit: Payer: Medicare Other

## 2015-08-03 DIAGNOSIS — Z79899 Other long term (current) drug therapy: Secondary | ICD-10-CM | POA: Diagnosis not present

## 2015-08-03 DIAGNOSIS — Z23 Encounter for immunization: Secondary | ICD-10-CM | POA: Diagnosis not present

## 2015-08-03 DIAGNOSIS — F0390 Unspecified dementia without behavioral disturbance: Secondary | ICD-10-CM | POA: Diagnosis not present

## 2015-08-03 DIAGNOSIS — Z9181 History of falling: Secondary | ICD-10-CM | POA: Diagnosis not present

## 2015-08-03 DIAGNOSIS — E559 Vitamin D deficiency, unspecified: Secondary | ICD-10-CM | POA: Diagnosis not present

## 2015-08-03 DIAGNOSIS — M8580 Other specified disorders of bone density and structure, unspecified site: Secondary | ICD-10-CM | POA: Diagnosis not present

## 2015-08-03 DIAGNOSIS — M159 Polyosteoarthritis, unspecified: Secondary | ICD-10-CM | POA: Diagnosis not present

## 2015-08-03 DIAGNOSIS — Z6821 Body mass index (BMI) 21.0-21.9, adult: Secondary | ICD-10-CM | POA: Diagnosis not present

## 2016-02-01 ENCOUNTER — Encounter: Payer: Self-pay | Admitting: Internal Medicine

## 2016-04-02 DIAGNOSIS — Z6822 Body mass index (BMI) 22.0-22.9, adult: Secondary | ICD-10-CM | POA: Diagnosis not present

## 2016-04-02 DIAGNOSIS — Z1389 Encounter for screening for other disorder: Secondary | ICD-10-CM | POA: Diagnosis not present

## 2016-04-02 DIAGNOSIS — M8580 Other specified disorders of bone density and structure, unspecified site: Secondary | ICD-10-CM | POA: Diagnosis not present

## 2016-04-02 DIAGNOSIS — F0390 Unspecified dementia without behavioral disturbance: Secondary | ICD-10-CM | POA: Diagnosis not present

## 2016-04-02 DIAGNOSIS — M159 Polyosteoarthritis, unspecified: Secondary | ICD-10-CM | POA: Diagnosis not present

## 2016-04-02 DIAGNOSIS — Z79899 Other long term (current) drug therapy: Secondary | ICD-10-CM | POA: Diagnosis not present

## 2016-04-02 DIAGNOSIS — E559 Vitamin D deficiency, unspecified: Secondary | ICD-10-CM | POA: Diagnosis not present

## 2016-11-06 DIAGNOSIS — Z23 Encounter for immunization: Secondary | ICD-10-CM | POA: Diagnosis not present

## 2017-01-08 DIAGNOSIS — M8580 Other specified disorders of bone density and structure, unspecified site: Secondary | ICD-10-CM | POA: Diagnosis not present

## 2017-01-08 DIAGNOSIS — M159 Polyosteoarthritis, unspecified: Secondary | ICD-10-CM | POA: Diagnosis not present

## 2017-01-08 DIAGNOSIS — E559 Vitamin D deficiency, unspecified: Secondary | ICD-10-CM | POA: Diagnosis not present

## 2017-01-08 DIAGNOSIS — Z6823 Body mass index (BMI) 23.0-23.9, adult: Secondary | ICD-10-CM | POA: Diagnosis not present

## 2017-01-08 DIAGNOSIS — F0390 Unspecified dementia without behavioral disturbance: Secondary | ICD-10-CM | POA: Diagnosis not present

## 2017-01-08 DIAGNOSIS — Z9181 History of falling: Secondary | ICD-10-CM | POA: Diagnosis not present

## 2017-01-10 ENCOUNTER — Other Ambulatory Visit: Payer: Self-pay | Admitting: Nurse Practitioner

## 2017-01-10 DIAGNOSIS — Z1231 Encounter for screening mammogram for malignant neoplasm of breast: Secondary | ICD-10-CM

## 2017-01-10 DIAGNOSIS — M8580 Other specified disorders of bone density and structure, unspecified site: Secondary | ICD-10-CM

## 2017-01-28 DIAGNOSIS — F0390 Unspecified dementia without behavioral disturbance: Secondary | ICD-10-CM | POA: Diagnosis not present

## 2017-01-28 DIAGNOSIS — I10 Essential (primary) hypertension: Secondary | ICD-10-CM | POA: Diagnosis not present

## 2017-01-28 DIAGNOSIS — E559 Vitamin D deficiency, unspecified: Secondary | ICD-10-CM | POA: Diagnosis not present

## 2017-01-28 DIAGNOSIS — E663 Overweight: Secondary | ICD-10-CM | POA: Diagnosis not present

## 2017-01-28 DIAGNOSIS — M159 Polyosteoarthritis, unspecified: Secondary | ICD-10-CM | POA: Diagnosis not present

## 2017-01-28 DIAGNOSIS — Z6826 Body mass index (BMI) 26.0-26.9, adult: Secondary | ICD-10-CM | POA: Diagnosis not present

## 2017-01-28 DIAGNOSIS — M8580 Other specified disorders of bone density and structure, unspecified site: Secondary | ICD-10-CM | POA: Diagnosis not present

## 2017-02-26 DIAGNOSIS — M8580 Other specified disorders of bone density and structure, unspecified site: Secondary | ICD-10-CM | POA: Diagnosis not present

## 2017-04-23 ENCOUNTER — Other Ambulatory Visit: Payer: Self-pay | Admitting: Nurse Practitioner

## 2017-04-23 DIAGNOSIS — Z0000 Encounter for general adult medical examination without abnormal findings: Secondary | ICD-10-CM | POA: Diagnosis not present

## 2017-04-23 DIAGNOSIS — R5381 Other malaise: Secondary | ICD-10-CM

## 2017-04-23 DIAGNOSIS — N959 Unspecified menopausal and perimenopausal disorder: Secondary | ICD-10-CM | POA: Diagnosis not present

## 2017-04-23 DIAGNOSIS — Z9181 History of falling: Secondary | ICD-10-CM | POA: Diagnosis not present

## 2017-04-23 DIAGNOSIS — Z1231 Encounter for screening mammogram for malignant neoplasm of breast: Secondary | ICD-10-CM | POA: Diagnosis not present

## 2017-04-23 DIAGNOSIS — Z1389 Encounter for screening for other disorder: Secondary | ICD-10-CM | POA: Diagnosis not present

## 2017-04-23 DIAGNOSIS — F0390 Unspecified dementia without behavioral disturbance: Secondary | ICD-10-CM | POA: Diagnosis not present

## 2017-04-23 DIAGNOSIS — M8580 Other specified disorders of bone density and structure, unspecified site: Secondary | ICD-10-CM

## 2017-04-27 ENCOUNTER — Emergency Department: Payer: Medicare Other

## 2017-04-27 ENCOUNTER — Encounter: Payer: Self-pay | Admitting: Emergency Medicine

## 2017-04-27 ENCOUNTER — Inpatient Hospital Stay
Admission: EM | Admit: 2017-04-27 | Discharge: 2017-05-01 | DRG: 493 | Disposition: A | Discharge status: Skilled Nursing Facility | Payer: Medicare Other | Attending: Internal Medicine | Admitting: Internal Medicine

## 2017-04-27 DIAGNOSIS — S82841A Displaced bimalleolar fracture of right lower leg, initial encounter for closed fracture: Principal | ICD-10-CM | POA: Diagnosis present

## 2017-04-27 DIAGNOSIS — F05 Delirium due to known physiological condition: Secondary | ICD-10-CM | POA: Diagnosis present

## 2017-04-27 DIAGNOSIS — F0390 Unspecified dementia without behavioral disturbance: Secondary | ICD-10-CM | POA: Diagnosis not present

## 2017-04-27 DIAGNOSIS — Y92410 Unspecified street and highway as the place of occurrence of the external cause: Secondary | ICD-10-CM

## 2017-04-27 DIAGNOSIS — M25571 Pain in right ankle and joints of right foot: Secondary | ICD-10-CM | POA: Diagnosis not present

## 2017-04-27 DIAGNOSIS — S82491A Other fracture of shaft of right fibula, initial encounter for closed fracture: Secondary | ICD-10-CM | POA: Diagnosis not present

## 2017-04-27 DIAGNOSIS — R9431 Abnormal electrocardiogram [ECG] [EKG]: Secondary | ICD-10-CM | POA: Diagnosis not present

## 2017-04-27 DIAGNOSIS — S8261XA Displaced fracture of lateral malleolus of right fibula, initial encounter for closed fracture: Secondary | ICD-10-CM | POA: Diagnosis not present

## 2017-04-27 DIAGNOSIS — W1789XA Other fall from one level to another, initial encounter: Secondary | ICD-10-CM | POA: Diagnosis present

## 2017-04-27 DIAGNOSIS — Z01811 Encounter for preprocedural respiratory examination: Secondary | ICD-10-CM

## 2017-04-27 DIAGNOSIS — M1990 Unspecified osteoarthritis, unspecified site: Secondary | ICD-10-CM | POA: Diagnosis not present

## 2017-04-27 DIAGNOSIS — Y9301 Activity, walking, marching and hiking: Secondary | ICD-10-CM

## 2017-04-27 DIAGNOSIS — S99911A Unspecified injury of right ankle, initial encounter: Secondary | ICD-10-CM

## 2017-04-27 DIAGNOSIS — T148XXA Other injury of unspecified body region, initial encounter: Secondary | ICD-10-CM | POA: Diagnosis not present

## 2017-04-27 DIAGNOSIS — S82843A Displaced bimalleolar fracture of unspecified lower leg, initial encounter for closed fracture: Secondary | ICD-10-CM

## 2017-04-27 DIAGNOSIS — W06XXXA Fall from bed, initial encounter: Secondary | ICD-10-CM | POA: Diagnosis not present

## 2017-04-27 DIAGNOSIS — Y92230 Patient room in hospital as the place of occurrence of the external cause: Secondary | ICD-10-CM

## 2017-04-27 HISTORY — DX: Unspecified osteoarthritis, unspecified site: M19.90

## 2017-04-27 MED ORDER — BUPIVACAINE HCL (PF) 0.5 % IJ SOLN
30.0000 mL | Freq: Once | INTRAMUSCULAR | Status: AC
  Administered 2017-04-27: 30 mL

## 2017-04-27 MED ORDER — MORPHINE SULFATE (PF) 4 MG/ML IV SOLN
4.0000 mg | Freq: Once | INTRAVENOUS | Status: AC
  Administered 2017-04-27: 4 mg via INTRAVENOUS
  Filled 2017-04-27: qty 1

## 2017-04-27 MED ORDER — ONDANSETRON HCL 4 MG/2ML IJ SOLN
INTRAMUSCULAR | Status: AC
  Administered 2017-04-27: 4 mg
  Filled 2017-04-27: qty 2

## 2017-04-27 MED ORDER — OXYCODONE-ACETAMINOPHEN 5-325 MG PO TABS: 1.0000 | ORAL_TABLET | Freq: Four times a day (QID) | ORAL | 0 refills | Status: DC | PRN

## 2017-04-27 MED ORDER — BUPIVACAINE HCL (PF) 0.5 % IJ SOLN
INTRAMUSCULAR | Status: AC
  Administered 2017-04-27: 30 mL
  Filled 2017-04-27: qty 30

## 2017-04-27 NOTE — ED Triage Notes (Signed)
Slip and fall - no loc - swelling and bruising to rt ankle.

## 2017-04-27 NOTE — ED Provider Notes (Addendum)
[facility] Emergency Department Provider Note  ____________________________________________   I have reviewed the triage vital signs and the nursing notes.   HISTORY  Chief Complaint Ankle Pain    HPI Brandi Chang is a 83 y.o. female who slipped, she is not on any blood thinners, she did not hit her head, she injured her right ankle. She was able to walk a brief distance on it, happened this evening. No other injury. Obvious pain and deformity to that leg.    Past Medical History:  Diagnosis Date  . Arthritis     There are no active problems to display for this patient.   No past surgical history on file.  Prior to Admission medications   Not on File    Allergies Patient has no known allergies.  No family history on file.  Social History Social History  Substance Use Topics  . Smoking status: Never Smoker  . Smokeless tobacco: Never Used  . Alcohol use No    Review of Systems Constitutional: No fever/chills Eyes: No visual changes. ENT: No sore throat. No stiff neck no neck pain Cardiovascular: Denies chest pain. Respiratory: Denies shortness of breath. Gastrointestinal:   no vomiting.  No diarrhea.  No constipation. Genitourinary: Negative for dysuria. Musculoskeletal: Negative lower extremity swelling Skin: Negative for rash. Neurological: Negative for severe headaches, focal weakness or numbness.   ____________________________________________   PHYSICAL EXAM:  VITAL SIGNS: ED Triage Vitals  Enc Vitals Group     BP 04/27/17 2111 129/75     Pulse Rate 04/27/17 2111 78     Resp 04/27/17 2111 16     Temp 04/27/17 2111 98 F (36.7 C)     Temp Source 04/27/17 2111 Oral     SpO2 04/27/17 2111 97 %     Weight 04/27/17 2111 120 lb (54.4 kg)     Height 04/27/17 2111 5\' 2"  (1.575 m)     Head Circumference --      Peak Flow --      Pain Score 04/27/17 2110 5     Pain Loc --      Pain Edu? --      Excl. in GC? --      Constitutional: Alert and oriented. Well appearing and in no acute distress. Eyes: Conjunctivae are normal Head: Atraumatic HEENT: No congestion/rhinnorhea. Mucous membranes are moist.  Oropharynx non-erythematous Neck:   Nontender with no meningismus, no masses, no stridor Cardiovascular: Normal rate, regular rhythm. Grossly normal heart sounds.  Good peripheral circulation. Respiratory: Normal respiratory effort.  No retractions. Lungs CTAB. Abdominal: Soft and nontender. No distention. No guarding no rebound Back:  There is no focal tenderness or step off.  there is no midline tenderness there are no lesions noted. there is no CVA tenderness Musculoskeletal: There is pain and swelling to the right ankle with bruising noted, no significant deformity noted however aside from swelling at the spot of the distal fibula/tibia medially and laterally counter respectively. There is strong pulses,. The patient has no knee or hip pain. No joint effusions, no DVT signs strong distal pulses no edema Neurologic:  Normal speech and language. No gross focal neurologic deficits are appreciated.  Skin:  Skin is warm, dry and intact. No rash noted. Psychiatric: Mood and affect are normal. Speech and behavior are normal.  ____________________________________________   LABS (all labs ordered are listed, but only abnormal results are displayed)  Labs Reviewed - No data to display ____________________________________________  EKG  I  personally interpreted any EKGs ordered by me or triage  ____________________________________________  RADIOLOGY  I reviewed any imaging ordered by me or triage that were performed during my shift and, if possible, patient and/or family made aware of any abnormal findings. ____________________________________________   PROCEDURES  Procedure(s) performed: Hematoma block, too. #1, distal fibula, sterile procedure, sterilized with Betadine, using Marcaine after consent  and timeout, 3 cc placed in the hematoma. Patient admits to relief the pain.  #2, distal tibia, 3 cc of Marcaine administered with same sterile procedure with patient pain-free thereafter.  Procedures  Critical Care performed: None  ____________________________________________   INITIAL IMPRESSION / ASSESSMENT AND PLAN / ED COURSE  Pertinent labs & imaging results that were available during my care of the patient were reviewed by me and considered in my medical decision making (see chart for details).  Obvious fracture to the bimalleolar region not appear to be dislocated at this moment x-ray does confirm that. Strong distal pulses. No other obvious injury noted on exam. I did give the patient IV morphine, we also administered Marcaine, 0.5% and a hematoma block in both fractured bones. Afterward she was pain-free, patient tolerated procedure well no complications. We're placing her in a splint.  ----------------------------------------- 10:55 PM on 04/27/2017 -----------------------------------------  Patient remains pain-free discussed with Dr. present skiing agrees with splinting and close outpatient follow-up. Have placed a posterior and sugar tong splint on the patient she is neurovascularly intact afterwards. We will obtain x-ray to verify ongoing joint alignment and we will see if she can tolerate crutches., Patient neurologically and vascular intact after splint. Return precautions splint instructions given and understood. Pain medication provided. Her son will stay with her.  ----------------------------------------- 12:07 AM on 04/28/2017 -----------------------------------------  After pt up for d/c Dr. Martha ClanKrasinski called and stated he would perfer to admit pt for morning surgery. I have informed family and they are very comfortable with this plan. We will therefore admit.     ____________________________________________   FINAL CLINICAL IMPRESSION(S) / ED  DIAGNOSES  Final diagnoses:  Right ankle injury      This chart was dictated using voice recognition software.  Despite best efforts to proofread,  errors can occur which can change meaning.      Jeanmarie PlantMcShane, Samuele Storey A, MD 04/27/17 2242    Jeanmarie PlantMcShane, Joannie Medine A, MD 04/27/17 2255    Jeanmarie PlantMcShane, Breklyn Fabrizio A, MD 04/27/17 [id number]    Jeanmarie PlantMcShane, Lillie Bollig A, MD 04/28/17 [id number]

## 2017-04-28 ENCOUNTER — Inpatient Hospital Stay: Payer: Medicare Other | Admitting: Anesthesiology

## 2017-04-28 ENCOUNTER — Encounter
Admission: EM | Disposition: A | Discharge status: Skilled Nursing Facility | Payer: Self-pay | Source: Home / Self Care | Attending: Internal Medicine

## 2017-04-28 ENCOUNTER — Inpatient Hospital Stay: Payer: Medicare Other

## 2017-04-28 ENCOUNTER — Inpatient Hospital Stay: Admit: 2017-04-28 | Payer: Medicare Other

## 2017-04-28 DIAGNOSIS — W1789XA Other fall from one level to another, initial encounter: Secondary | ICD-10-CM | POA: Diagnosis present

## 2017-04-28 DIAGNOSIS — M1990 Unspecified osteoarthritis, unspecified site: Secondary | ICD-10-CM | POA: Diagnosis present

## 2017-04-28 DIAGNOSIS — S82843A Displaced bimalleolar fracture of unspecified lower leg, initial encounter for closed fracture: Secondary | ICD-10-CM | POA: Diagnosis present

## 2017-04-28 DIAGNOSIS — F0390 Unspecified dementia without behavioral disturbance: Secondary | ICD-10-CM | POA: Diagnosis present

## 2017-04-28 DIAGNOSIS — M25571 Pain in right ankle and joints of right foot: Secondary | ICD-10-CM | POA: Diagnosis not present

## 2017-04-28 DIAGNOSIS — F05 Delirium due to known physiological condition: Secondary | ICD-10-CM | POA: Diagnosis present

## 2017-04-28 DIAGNOSIS — S82841A Displaced bimalleolar fracture of right lower leg, initial encounter for closed fracture: Secondary | ICD-10-CM | POA: Diagnosis present

## 2017-04-28 DIAGNOSIS — Z01818 Encounter for other preprocedural examination: Secondary | ICD-10-CM | POA: Diagnosis not present

## 2017-04-28 DIAGNOSIS — Z7401 Bed confinement status: Secondary | ICD-10-CM | POA: Diagnosis not present

## 2017-04-28 DIAGNOSIS — F0391 Unspecified dementia with behavioral disturbance: Secondary | ICD-10-CM | POA: Diagnosis not present

## 2017-04-28 DIAGNOSIS — W06XXXA Fall from bed, initial encounter: Secondary | ICD-10-CM | POA: Diagnosis not present

## 2017-04-28 DIAGNOSIS — Z01810 Encounter for preprocedural cardiovascular examination: Secondary | ICD-10-CM | POA: Diagnosis not present

## 2017-04-28 DIAGNOSIS — Y92410 Unspecified street and highway as the place of occurrence of the external cause: Secondary | ICD-10-CM | POA: Diagnosis not present

## 2017-04-28 DIAGNOSIS — Y92230 Patient room in hospital as the place of occurrence of the external cause: Secondary | ICD-10-CM | POA: Diagnosis not present

## 2017-04-28 DIAGNOSIS — S9304XD Dislocation of right ankle joint, subsequent encounter: Secondary | ICD-10-CM | POA: Diagnosis not present

## 2017-04-28 DIAGNOSIS — W19XXXD Unspecified fall, subsequent encounter: Secondary | ICD-10-CM | POA: Diagnosis not present

## 2017-04-28 DIAGNOSIS — Y9301 Activity, walking, marching and hiking: Secondary | ICD-10-CM | POA: Diagnosis not present

## 2017-04-28 DIAGNOSIS — S82841D Displaced bimalleolar fracture of right lower leg, subsequent encounter for closed fracture with routine healing: Secondary | ICD-10-CM | POA: Diagnosis not present

## 2017-04-28 HISTORY — PX: ORIF ANKLE FRACTURE: SHX5408

## 2017-04-28 LAB — CBC WITH DIFFERENTIAL/PLATELET
BASOS ABS: 0.1 10*3/uL (ref 0–0.1)
BASOS PCT: 1 %
EOS ABS: 0 10*3/uL (ref 0–0.7)
EOS PCT: 0 %
HCT: 34 % — ABNORMAL LOW (ref 35.0–47.0)
Hemoglobin: 11.9 g/dL — ABNORMAL LOW (ref 12.0–16.0)
LYMPHS ABS: 1.7 10*3/uL (ref 1.0–3.6)
Lymphocytes Relative: 20 %
MCH: 33.3 pg (ref 26.0–34.0)
MCHC: 34.8 g/dL (ref 32.0–36.0)
MCV: 95.6 fL (ref 80.0–100.0)
Monocytes Absolute: 1 10*3/uL — ABNORMAL HIGH (ref 0.2–0.9)
Monocytes Relative: 11 %
Neutro Abs: 6.1 10*3/uL (ref 1.4–6.5)
Neutrophils Relative %: 68 %
PLATELETS: 219 10*3/uL (ref 150–440)
RBC: 3.56 MIL/uL — AB (ref 3.80–5.20)
RDW: 13.8 % (ref 11.5–14.5)
WBC: 8.9 10*3/uL (ref 3.6–11.0)

## 2017-04-28 LAB — COMPREHENSIVE METABOLIC PANEL
ALT: 17 U/L (ref 14–54)
ANION GAP: 5 (ref 5–15)
AST: 25 U/L (ref 15–41)
Albumin: 3.8 g/dL (ref 3.5–5.0)
Alkaline Phosphatase: 73 U/L (ref 38–126)
BUN: 21 mg/dL — ABNORMAL HIGH (ref 6–20)
CHLORIDE: 104 mmol/L (ref 101–111)
CO2: 28 mmol/L (ref 22–32)
CREATININE: 0.47 mg/dL (ref 0.44–1.00)
Calcium: 8.5 mg/dL — ABNORMAL LOW (ref 8.9–10.3)
GFR calc non Af Amer: >60 mL/min (ref >60)
Glucose, Bld: 108 mg/dL — ABNORMAL HIGH (ref 65–99)
Potassium: 4.2 mmol/L (ref 3.5–5.1)
SODIUM: 137 mmol/L (ref 135–145)
Total Bilirubin: 1 mg/dL (ref 0.3–1.2)
Total Protein: 6.7 g/dL (ref 6.5–8.1)

## 2017-04-28 LAB — PROTIME-INR
INR: 1.03
PROTHROMBIN TIME: 13.5 s (ref 11.4–15.2)

## 2017-04-28 LAB — CBC
HCT: 34.8 % — ABNORMAL LOW (ref 35.0–47.0)
Hemoglobin: 11.8 g/dL — ABNORMAL LOW (ref 12.0–16.0)
MCH: 32 pg (ref 26.0–34.0)
MCHC: 34 g/dL (ref 32.0–36.0)
MCV: 94.1 fL (ref 80.0–100.0)
PLATELETS: 253 10*3/uL (ref 150–440)
RBC: 3.7 MIL/uL — ABNORMAL LOW (ref 3.80–5.20)
RDW: 13.8 % (ref 11.5–14.5)
WBC: 12.6 10*3/uL — AB (ref 3.6–11.0)

## 2017-04-28 LAB — BASIC METABOLIC PANEL
ANION GAP: 8 (ref 5–15)
BUN: 20 mg/dL (ref 6–20)
CALCIUM: 8.9 mg/dL (ref 8.9–10.3)
CO2: 27 mmol/L (ref 22–32)
CREATININE: 0.56 mg/dL (ref 0.44–1.00)
Chloride: 103 mmol/L (ref 101–111)
GFR calc Af Amer: >60 mL/min (ref >60)
GLUCOSE: 121 mg/dL — AB (ref 65–99)
Potassium: 4.1 mmol/L (ref 3.5–5.1)
Sodium: 138 mmol/L (ref 135–145)

## 2017-04-28 LAB — TYPE AND SCREEN
ABO/RH(D): O POS
ANTIBODY SCREEN: NEGATIVE

## 2017-04-28 LAB — APTT: aPTT: 31 seconds (ref 24–36)

## 2017-04-28 LAB — MRSA PCR SCREENING: MRSA BY PCR: NEGATIVE

## 2017-04-28 SURGERY — OPEN REDUCTION INTERNAL FIXATION (ORIF) ANKLE FRACTURE
Anesthesia: General | Laterality: Right

## 2017-04-28 MED ORDER — MAGNESIUM CITRATE PO SOLN
1.0000 | Freq: Once | ORAL | Status: DC | PRN
  Filled 2017-04-28: qty 296

## 2017-04-28 MED ORDER — ASPIRIN EC 325 MG PO TBEC: 325.0000 mg | DELAYED_RELEASE_TABLET | Freq: Every day | ORAL | Status: DC

## 2017-04-28 MED ORDER — MENTHOL 3 MG MT LOZG
1.0000 | LOZENGE | OROMUCOSAL | Status: DC | PRN
  Filled 2017-04-28: qty 9

## 2017-04-28 MED ORDER — PROPOFOL 500 MG/50ML IV EMUL
INTRAVENOUS | Status: DC | PRN
  Administered 2017-04-28: 50 ug/kg/min via INTRAVENOUS

## 2017-04-28 MED ORDER — CEFAZOLIN SODIUM-DEXTROSE 1-4 GM/50ML-% IV SOLN
1.0000 g | Freq: Four times a day (QID) | INTRAVENOUS | Status: AC
  Administered 2017-04-28 (×2): 1 g via INTRAVENOUS
  Filled 2017-04-28 (×2): qty 50

## 2017-04-28 MED ORDER — SODIUM CHLORIDE 0.9 % IV SOLN
INTRAVENOUS | Status: DC
  Administered 2017-04-28: 06:00:00 via INTRAVENOUS

## 2017-04-28 MED ORDER — FENTANYL CITRATE (PF) 100 MCG/2ML IJ SOLN
INTRAMUSCULAR | Status: DC | PRN
  Administered 2017-04-28 (×2): 50 ug via INTRAVENOUS

## 2017-04-28 MED ORDER — SUGAMMADEX SODIUM 200 MG/2ML IV SOLN
INTRAVENOUS | Status: DC | PRN
  Administered 2017-04-28: 120 mg via INTRAVENOUS

## 2017-04-28 MED ORDER — PROPOFOL 500 MG/50ML IV EMUL
INTRAVENOUS | Status: AC
  Filled 2017-04-28: qty 50

## 2017-04-28 MED ORDER — ACETAMINOPHEN 650 MG RE SUPP: 650.0000 mg | Freq: Four times a day (QID) | RECTAL | Status: DC | PRN

## 2017-04-28 MED ORDER — NEOMYCIN-POLYMYXIN B GU 40-200000 IR SOLN
Status: AC
  Filled 2017-04-28: qty 4

## 2017-04-28 MED ORDER — DOCUSATE SODIUM 100 MG PO CAPS
100.0000 mg | ORAL_CAPSULE | Freq: Two times a day (BID) | ORAL | Status: DC
  Administered 2017-04-29 – 2017-05-01 (×2): 100 mg via ORAL
  Filled 2017-04-28 (×2): qty 1

## 2017-04-28 MED ORDER — OXYCODONE HCL 5 MG PO TABS
5.0000 mg | ORAL_TABLET | ORAL | Status: DC | PRN
  Administered 2017-04-29: 5 mg via ORAL
  Filled 2017-04-28: qty 1

## 2017-04-28 MED ORDER — FENTANYL CITRATE (PF) 100 MCG/2ML IJ SOLN
INTRAMUSCULAR | Status: AC
Start: 2017-04-28 — End: 2017-04-28
  Filled 2017-04-28: qty 2

## 2017-04-28 MED ORDER — ROCURONIUM BROMIDE 100 MG/10ML IV SOLN
INTRAVENOUS | Status: DC | PRN
  Administered 2017-04-28: 30 mg via INTRAVENOUS

## 2017-04-28 MED ORDER — SUCCINYLCHOLINE CHLORIDE 20 MG/ML IJ SOLN
INTRAMUSCULAR | Status: DC | PRN
  Administered 2017-04-28: 80 mg via INTRAVENOUS

## 2017-04-28 MED ORDER — PHENOL 1.4 % MT LIQD
1.0000 | OROMUCOSAL | Status: DC | PRN
  Filled 2017-04-28: qty 177

## 2017-04-28 MED ORDER — BUPIVACAINE HCL 0.25 % IJ SOLN
INTRAMUSCULAR | Status: DC | PRN
Start: 2017-04-28 — End: 2017-04-28
  Administered 2017-04-28: 30 mL

## 2017-04-28 MED ORDER — NEOMYCIN-POLYMYXIN B GU 40-200000 IR SOLN
Status: DC | PRN
  Administered 2017-04-28: 4 mL

## 2017-04-28 MED ORDER — METHOCARBAMOL 500 MG PO TABS
500.0000 mg | ORAL_TABLET | Freq: Four times a day (QID) | ORAL | Status: DC | PRN
  Administered 2017-04-30: 500 mg via ORAL
  Filled 2017-04-28: qty 1

## 2017-04-28 MED ORDER — ROCURONIUM BROMIDE 50 MG/5ML IV SOLN
INTRAVENOUS | Status: AC
  Filled 2017-04-28: qty 1

## 2017-04-28 MED ORDER — ONDANSETRON HCL 4 MG/2ML IJ SOLN
4.0000 mg | Freq: Once | INTRAMUSCULAR | Status: DC | PRN
Start: 2017-04-28 — End: 2017-04-28

## 2017-04-28 MED ORDER — ONDANSETRON HCL 4 MG/2ML IJ SOLN: 4.0000 mg | Freq: Four times a day (QID) | INTRAMUSCULAR | Status: DC | PRN

## 2017-04-28 MED ORDER — ONDANSETRON HCL 4 MG/2ML IJ SOLN
INTRAMUSCULAR | Status: DC | PRN
  Administered 2017-04-28: 4 mg via INTRAVENOUS

## 2017-04-28 MED ORDER — DEXTROSE 5 % IV SOLN
500.0000 mg | Freq: Four times a day (QID) | INTRAVENOUS | Status: DC | PRN
  Filled 2017-04-28: qty 5

## 2017-04-28 MED ORDER — SENNA 8.6 MG PO TABS
1.0000 | ORAL_TABLET | Freq: Two times a day (BID) | ORAL | Status: DC
Start: 2017-04-28 — End: 2017-05-01
  Administered 2017-05-01: 8.6 mg via ORAL
  Filled 2017-04-28 (×2): qty 1

## 2017-04-28 MED ORDER — LIDOCAINE HCL (PF) 2 % IJ SOLN
INTRAMUSCULAR | Status: AC
  Filled 2017-04-28: qty 2

## 2017-04-28 MED ORDER — ACETAMINOPHEN 10 MG/ML IV SOLN
INTRAVENOUS | Status: DC | PRN
  Administered 2017-04-28: 1000 mg via INTRAVENOUS

## 2017-04-28 MED ORDER — ACETAMINOPHEN 10 MG/ML IV SOLN
INTRAVENOUS | Status: AC
  Filled 2017-04-28: qty 100

## 2017-04-28 MED ORDER — SUCCINYLCHOLINE CHLORIDE 20 MG/ML IJ SOLN
INTRAMUSCULAR | Status: AC
  Filled 2017-04-28: qty 1

## 2017-04-28 MED ORDER — SODIUM CHLORIDE 0.9 % IV SOLN
75.0000 mL/h | INTRAVENOUS | Status: DC
  Administered 2017-04-28: 75 mL/h via INTRAVENOUS

## 2017-04-28 MED ORDER — PROPOFOL 10 MG/ML IV BOLUS
INTRAVENOUS | Status: AC
  Filled 2017-04-28: qty 20

## 2017-04-28 MED ORDER — SUGAMMADEX SODIUM 200 MG/2ML IV SOLN
INTRAVENOUS | Status: AC
  Filled 2017-04-28: qty 2

## 2017-04-28 MED ORDER — LIDOCAINE HCL (CARDIAC) 20 MG/ML IV SOLN
INTRAVENOUS | Status: DC | PRN
  Administered 2017-04-28: 60 mg via INTRAVENOUS

## 2017-04-28 MED ORDER — ONDANSETRON HCL 4 MG PO TABS: 4.0000 mg | ORAL_TABLET | Freq: Four times a day (QID) | ORAL | Status: DC | PRN

## 2017-04-28 MED ORDER — LACTATED RINGERS IV SOLN
INTRAVENOUS | Status: DC | PRN
  Administered 2017-04-28: 10:00:00 via INTRAVENOUS

## 2017-04-28 MED ORDER — ALUM & MAG HYDROXIDE-SIMETH 200-200-20 MG/5ML PO SUSP: 30.0000 mL | ORAL | Status: DC | PRN

## 2017-04-28 MED ORDER — MORPHINE SULFATE (PF) 2 MG/ML IV SOLN
2.0000 mg | INTRAVENOUS | Status: DC | PRN
  Administered 2017-04-28: 2 mg via INTRAVENOUS
  Filled 2017-04-28: qty 1

## 2017-04-28 MED ORDER — ACETAMINOPHEN 325 MG PO TABS
650.0000 mg | ORAL_TABLET | Freq: Four times a day (QID) | ORAL | Status: DC | PRN
  Filled 2017-04-28: qty 2

## 2017-04-28 MED ORDER — BISACODYL 10 MG RE SUPP: 10.0000 mg | Freq: Every day | RECTAL | Status: DC | PRN

## 2017-04-28 MED ORDER — ONDANSETRON HCL 4 MG/2ML IJ SOLN
INTRAMUSCULAR | Status: AC
  Filled 2017-04-28: qty 2

## 2017-04-28 MED ORDER — BUPIVACAINE HCL (PF) 0.25 % IJ SOLN
INTRAMUSCULAR | Status: AC
  Filled 2017-04-28: qty 30

## 2017-04-28 MED ORDER — PROPOFOL 10 MG/ML IV BOLUS
INTRAVENOUS | Status: DC | PRN
  Administered 2017-04-28: 120 mg via INTRAVENOUS

## 2017-04-28 MED ORDER — POLYETHYLENE GLYCOL 3350 17 G PO PACK: 17.0000 g | PACK | Freq: Every day | ORAL | Status: DC | PRN

## 2017-04-28 MED ORDER — FENTANYL CITRATE (PF) 100 MCG/2ML IJ SOLN: 25.0000 ug | INTRAMUSCULAR | Status: DC | PRN

## 2017-04-28 MED ORDER — CEFAZOLIN SODIUM-DEXTROSE 1-4 GM/50ML-% IV SOLN
1.0000 g | INTRAVENOUS | Status: AC
  Administered 2017-04-28: 1 g via INTRAVENOUS
  Filled 2017-04-28: qty 50

## 2017-04-28 SURGICAL SUPPLY — 52 items
BANDAGE ELASTIC 4 LF NS (GAUZE/BANDAGES/DRESSINGS) ×6 IMPLANT
BIT DRILL 2.5X110 QC LCP DISP (BIT) ×3 IMPLANT
BIT DRILL CANN 2.7X625 NONSTRL (BIT) ×3 IMPLANT
BLADE SURG 15 STRL LF DISP TIS (BLADE) ×1 IMPLANT
BLADE SURG 15 STRL SS (BLADE) ×2
BNDG COHESIVE 4X5 WHT NS (GAUZE/BANDAGES/DRESSINGS) ×3 IMPLANT
BNDG ESMARK 6X12 TAN STRL LF (GAUZE/BANDAGES/DRESSINGS) ×3 IMPLANT
CLOSURE WOUND 1/2 X4 (GAUZE/BANDAGES/DRESSINGS)
CUFF TOURN 24 STER (MISCELLANEOUS) IMPLANT
CUFF TOURN 30 STER DUAL PORT (MISCELLANEOUS) IMPLANT
DRAPE FLUOR MINI C-ARM 54X84 (DRAPES) ×3 IMPLANT
DRAPE INCISE IOBAN 66X45 STRL (DRAPES) ×3 IMPLANT
DRAPE U-SHAPE 47X51 STRL (DRAPES) ×3 IMPLANT
DURAPREP 26ML APPLICATOR (WOUND CARE) ×6 IMPLANT
GAUZE PETRO XEROFOAM 1X8 (MISCELLANEOUS) ×3 IMPLANT
GAUZE SPONGE 4X4 12PLY STRL (GAUZE/BANDAGES/DRESSINGS) ×3 IMPLANT
GAUZE XEROFORM 4X4 STRL (GAUZE/BANDAGES/DRESSINGS) ×3 IMPLANT
GLOVE BIOGEL PI IND STRL 9 (GLOVE) ×1 IMPLANT
GLOVE BIOGEL PI INDICATOR 9 (GLOVE) ×2
GLOVE SURG 9.0 ORTHO LTXF (GLOVE) ×6 IMPLANT
GOWN STRL REUS TWL 2XL XL LVL4 (GOWN DISPOSABLE) ×3 IMPLANT
GOWN STRL REUS W/ TWL LRG LVL3 (GOWN DISPOSABLE) ×1 IMPLANT
GOWN STRL REUS W/TWL LRG LVL3 (GOWN DISPOSABLE) ×2
GUIDEWIRE THREADED 150MM (WIRE) ×9 IMPLANT
KIT RM TURNOVER STRD PROC AR (KITS) ×3 IMPLANT
LABEL OR SOLS (LABEL) ×3 IMPLANT
NS IRRIG 1000ML POUR BTL (IV SOLUTION) ×3 IMPLANT
PACK EXTREMITY ARMC (MISCELLANEOUS) ×3 IMPLANT
PAD ABD DERMACEA PRESS 5X9 (GAUZE/BANDAGES/DRESSINGS) ×6 IMPLANT
PAD CAST CTTN 4X4 STRL (SOFTGOODS) ×3 IMPLANT
PADDING CAST COTTON 4X4 STRL (SOFTGOODS) ×6
PLATE LCP 3.5 1/3 TUB 7HX81 (Plate) ×3 IMPLANT
SCREW CANC FT ST SFS 4X16 (Screw) ×3 IMPLANT
SCREW CANC FT/18 4.0 (Screw) ×9 IMPLANT
SCREW CANN L THRD/34 4.0 (Screw) ×3 IMPLANT
SCREW CANN L THRD/36 4.0 (Screw) ×3 IMPLANT
SCREW CANN L THRD/40 4.0 (Screw) ×3 IMPLANT
SCREW CORTEX 3.5 14MM (Screw) ×6 IMPLANT
SCREW CORTEX 3.5 16MM (Screw) ×2 IMPLANT
SCREW CORTEX 3.5 20MM (Screw) ×2 IMPLANT
SCREW LOCK CORT ST 3.5X14 (Screw) ×3 IMPLANT
SCREW LOCK CORT ST 3.5X16 (Screw) ×1 IMPLANT
SCREW LOCK CORT ST 3.5X20 (Screw) ×1 IMPLANT
SPLINT CAST 1 STEP 4X30 (MISCELLANEOUS) ×6 IMPLANT
SPONGE LAP 18X18 5 PK (GAUZE/BANDAGES/DRESSINGS) ×3 IMPLANT
STAPLER SKIN PROX 35W (STAPLE) ×3 IMPLANT
STOCKINETTE STRL 6IN 960660 (GAUZE/BANDAGES/DRESSINGS) ×3 IMPLANT
STRIP CLOSURE SKIN 1/2X4 (GAUZE/BANDAGES/DRESSINGS) IMPLANT
SUT VIC AB 2-0 SH 27 (SUTURE) ×4
SUT VIC AB 2-0 SH 27XBRD (SUTURE) ×2 IMPLANT
SYR 30ML LL (SYRINGE) ×3 IMPLANT
TAPE MICROPORE 2IN (TAPE) ×3 IMPLANT

## 2017-04-28 NOTE — Anesthesia Preprocedure Evaluation (Signed)
Anesthesia Evaluation  Patient identified by MRN, date of birth, ID band Patient awake    Reviewed: Allergy & Precautions, H&P , NPO status , Patient's Chart, lab work & pertinent test results, reviewed documented beta blocker date and time   Airway Mallampati: II  TM Distance: >3 FB Neck ROM: full    Dental  (+) Teeth Intact   Pulmonary neg pulmonary ROS,    Pulmonary exam normal        Cardiovascular Exercise Tolerance: Good negative cardio ROS Normal cardiovascular exam Rate:Normal     Neuro/Psych negative neurological ROS  negative psych ROS   GI/Hepatic negative GI ROS, Neg liver ROS,   Endo/Other  negative endocrine ROS  Renal/GU negative Renal ROS  negative genitourinary   Musculoskeletal   Abdominal   Peds  Hematology negative hematology ROS (+)   Anesthesia Other Findings   Reproductive/Obstetrics negative OB ROS                             Anesthesia Physical Anesthesia Plan  ASA: II  Anesthesia Plan: General LMA and Spinal   Post-op Pain Management:    Induction:   PONV Risk Score and Plan: 4 or greater and Ondansetron, Dexamethasone, Propofol and Midazolam  Airway Management Planned:   Additional Equipment:   Intra-op Plan:   Post-operative Plan:   Informed Consent: I have reviewed the patients History and Physical, chart, labs and discussed the procedure including the risks, benefits and alternatives for the proposed anesthesia with the patient or authorized representative who has indicated his/her understanding and acceptance.     Plan Discussed with: CRNA  Anesthesia Plan Comments:         Anesthesia Quick Evaluation

## 2017-04-28 NOTE — Transfer of Care (Signed)
Immediate Anesthesia Transfer of Care Note  Patient: Brandi Chang  Procedure(s) Performed: Procedure(s): OPEN REDUCTION INTERNAL FIXATION (ORIF) ANKLE FRACTURE (Right)  Patient Location: PACU  Anesthesia Type:General  Level of Consciousness: awake  Airway & Oxygen Therapy: Patient connected to face mask oxygen  Post-op Assessment: Post -op Vital signs reviewed and stable  Post vital signs: stable  Last Vitals:  Vitals:   04/28/17 0744 04/28/17 1204  BP: (!) 141/53 (!) 190/82  Pulse: 62 68  Resp: 18 14  Temp: 37.2 C 36.7 C    Last Pain:  Vitals:   04/28/17 1204  TempSrc: Temporal  PainSc:       Patients Stated Pain Goal: 1 (04/28/17 0847)  Complications: No apparent anesthesia complications

## 2017-04-28 NOTE — Consult Note (Signed)
ORTHOPAEDIC CONSULTATION  REQUESTING PHYSICIAN: Katha Hamming, MD  Chief Complaint: Right ankle pain status post fall  HPI: Brandi Chang is a 83 y.o. female who complains of right ankle pain following a fall yesterday.  Patient was walking on an incline and lost her balance and fell off a short wall landing on her right ankle causing the injury. X-rays at the [facility] emergency Department revealed bimalleolar ankle fracture with talar subluxation. Patient was admitted overnight with the plan for surgery today. Patient is a Scientist, product/process development. She is seen in her hospital room this morning with her sons.  Past Medical History:  Diagnosis Date  . Arthritis    No past surgical history on file. Social History   Social History  . Marital status: Widowed    Spouse name: N/A  . Number of children: N/A  . Years of education: N/A   Social History Main Topics  . Smoking status: Never Smoker  . Smokeless tobacco: Never Used  . Alcohol use No  . Drug use: No  . Sexual activity: Not Asked   Other Topics Concern  . None   Social History Narrative  . None   No family history on file. No Known Allergies Prior to Admission medications   Medication Sig Start Date End Date Taking? Authorizing Provider  meloxicam (MOBIC) 15 MG tablet Take 1 tablet by mouth daily. 01/23/17   [provider]  oxyCODONE-acetaminophen (ROXICET) 5-325 MG tablet Take 1 tablet by mouth every 6 (six) hours as needed. 04/27/17 04/27/18  Jeanmarie Plant, MD   Dg Ankle 2 Views Right  Result Date: 04/27/2017 CLINICAL DATA:  Ankle fracture, post reduction EXAM: RIGHT ANKLE - 2 VIEW COMPARISON:  Ankle radiograph 04/27/2017 FINDINGS: The alignment of the ankle fracture involving both malleoli is unchanged. There is persistent medial displacement of the talus relative to the ankle mortise. The distal fracture fragment of the right fibula is also displaced laterally. There is circumferential soft  tissue swelling. IMPRESSION: Unchanged alignment of the bimalleolar right ankle fracture. Electronically Signed   By: Deatra Robinson M.D.   On: 04/27/2017 23:14   Dg Ankle Complete Right  Result Date: 04/27/2017 CLINICAL DATA:  Patient slipped and fell on right ankle with swelling and bruising. EXAM: RIGHT ANKLE - COMPLETE 3+ VIEW COMPARISON:  None. FINDINGS: Acute, bimalleolar closed fracture-subluxation of the ankle joint is noted with an oblique intra-articular fracture involving the distal fibular metaphysis extending into the ankle joint with 1/2 shaft wdith lateral displacement of the fibular tip. A transverse fracture through the medial malleolus is also noted with widening of the medial clear space of the ankle joint up to 13 mm from lateral subluxation of the talar dome relative to the tibial plafond. Tiny ossific fracture fragments are noted along the anterior and medial aspect of the ankle joint. There is periarticular soft tissues swelling secondary to fracture. The posterior malleolar fracture is not conclusively identified. Calcaneal enthesophytes are seen along the plantar dorsal aspect. IMPRESSION: 1. Acute, closed, bimalleolar fracture- lateral subluxation of the ankle joint is noted with an oblique intra-articular fracture involving the distal fibular metaphysis extending into the ankle joint and 1/2 shaft width lateral displacement of the fibular tip. 2. A transverse fracture through the medial malleolus is also noted with widening of the medial clear space of the ankle joint up to 13 mm from lateral subluxation of the talar dome relative to the tibial plafond. 3. No definite posterior malleolar fracture though could be occult  due to overlap of osseous elements. 4. Small ankle joint effusion with tiny ossific intra-articular loose bodies noted anteriorly and possibly medially. Electronically Signed   By: Tollie Ethavid  Kwon M.D.   On: 04/27/2017 22:17   Chest Portable 1 View  Result Date:  04/28/2017 CLINICAL DATA:  Preoperative radiograph EXAM: PORTABLE CHEST 1 VIEW COMPARISON:  None. FINDINGS: The heart size and mediastinal contours are within normal limits. Both lungs are clear. The visualized skeletal structures are unremarkable. IMPRESSION: No active disease. Electronically Signed   By: Deatra RobinsonKevin  Herman M.D.   On: 04/28/2017 06:13    Positive ROS: All other systems have been reviewed and were otherwise negative with the exception of those mentioned in the HPI and as above.  Physical Exam: General: Alert, no acute distress  MUSCULOSKELETAL: Patient's AO splint was opened for examination. Her skin is intact. She is a palpable pedal pulses. She can flex and extend her toes. There is no gross deformity. Her compartments are soft and compressible. There is no significant swelling.  Assessment: Displaced bimalleolar ankle fracture with talar subluxation  Plan: I described the fracture to both the patient and her sons. I explained that the fracture has destabilized the ankle and requires open reduction internal fixation. I described the operation in detail as well as the postoperative course. We also discussed the risks and benefits of surgery. She understands the risks include but are not limited to infection, bleeding, nerve or blood vessel injury, malunion, nonunion, osteoarthritis, ankle stiffness, persistent pain or instability, hardware failure and the need for further surgery. She understands medical risks include but are not limited to DVT and pulmonary embolism, myocardial infarction, stroke, pneumonia, respiratory failure and death. The patient and her family understood these risks and agreed with the plan for surgery.  Patient has been cleared for surgery by the hospitalist service. She is at moderate risk given her advanced age.    Brandi Chang, Brandi Maggio, MD    04/28/2017 9:37 AM

## 2017-04-28 NOTE — Anesthesia Post-op Follow-up Note (Cosign Needed)
Anesthesia QCDR form completed.        

## 2017-04-28 NOTE — Progress Notes (Signed)
Subjective:  POST-OP CHECK:  Status post ORIF of right ankle this morning. Patient reports right ankle pain as mild.  Patient is resting comfortably. Her family is at the bedside. Patient has no complaints.  Objective:   VITALS:   Vitals:   04/28/17 1220 04/28/17 1235 04/28/17 1250 04/28/17 1306  BP: (!) 170/64 (!) 169/65 (!) 164/58 (!) 148/64  Pulse: 69 60 (!) 58 61  Resp: 16 18 18    Temp:  97.7 F (36.5 C)  98.7 F (37.1 C)  TempSrc:    Axillary  SpO2: 98% 100% 96% 98%  Weight:      Height:        PHYSICAL EXAM: Right lower extremity: Neurovascular intact Sensation intact distally Intact pulses distally Dorsiflexion/Plantar flexion intact Incision: dressing C/D/I Compartment soft  LABS  Results for orders placed or performed during the hospital encounter of 04/27/17 (from the past 24 hour(s))  CBC     Status: Abnormal   Collection Time: 04/28/17 12:24 AM  Result Value Ref Range   WBC 12.6 (H) 3.6 - 11.0 K/uL   RBC 3.70 (L) 3.80 - 5.20 MIL/uL   Hemoglobin 11.8 (L) 12.0 - 16.0 g/dL   HCT 16.1 (L) 09.6 - 04.5 %   MCV 94.1 80.0 - 100.0 fL   MCH 32.0 26.0 - 34.0 pg   MCHC 34.0 32.0 - 36.0 g/dL   RDW 40.9 81.1 - 91.4 %   Platelets 253 150 - 440 K/uL  Basic metabolic panel     Status: Abnormal   Collection Time: 04/28/17 12:24 AM  Result Value Ref Range   Sodium 138 135 - 145 mmol/L   Potassium 4.1 3.5 - 5.1 mmol/L   Chloride 103 101 - 111 mmol/L   CO2 27 22 - 32 mmol/L   Glucose, Bld 121 (H) 65 - 99 mg/dL   BUN 20 6 - 20 mg/dL   Creatinine, Ser 7.82 0.44 - 1.00 mg/dL   Calcium 8.9 8.9 - 95.6 mg/dL   GFR calc non Af Amer >60 >60 mL/min   GFR calc Af Amer >60 >60 mL/min   Anion gap 8 5 - 15  MRSA PCR Screening     Status: None   Collection Time: 04/28/17  5:20 AM  Result Value Ref Range   MRSA by PCR NEGATIVE NEGATIVE  Protime-INR     Status: None   Collection Time: 04/28/17  5:53 AM  Result Value Ref Range   Prothrombin Time 13.5 11.4 - 15.2 seconds    INR 1.03   CBC WITH DIFFERENTIAL     Status: Abnormal   Collection Time: 04/28/17  5:53 AM  Result Value Ref Range   WBC 8.9 3.6 - 11.0 K/uL   RBC 3.56 (L) 3.80 - 5.20 MIL/uL   Hemoglobin 11.9 (L) 12.0 - 16.0 g/dL   HCT 21.3 (L) 08.6 - 57.8 %   MCV 95.6 80.0 - 100.0 fL   MCH 33.3 26.0 - 34.0 pg   MCHC 34.8 32.0 - 36.0 g/dL   RDW 46.9 62.9 - 52.8 %   Platelets 219 150 - 440 K/uL   Neutrophils Relative % 68 %   Neutro Abs 6.1 1.4 - 6.5 K/uL   Lymphocytes Relative 20 %   Lymphs Abs 1.7 1.0 - 3.6 K/uL   Monocytes Relative 11 %   Monocytes Absolute 1.0 (H) 0.2 - 0.9 K/uL   Eosinophils Relative 0 %   Eosinophils Absolute 0.0 0 - 0.7 K/uL   Basophils Relative 1 %  Basophils Absolute 0.1 0 - 0.1 K/uL  Comprehensive metabolic panel     Status: Abnormal   Collection Time: 04/28/17  5:53 AM  Result Value Ref Range   Sodium 137 135 - 145 mmol/L   Potassium 4.2 3.5 - 5.1 mmol/L   Chloride 104 101 - 111 mmol/L   CO2 28 22 - 32 mmol/L   Glucose, Bld 108 (H) 65 - 99 mg/dL   BUN 21 (H) 6 - 20 mg/dL   Creatinine, Ser 4.09 0.44 - 1.00 mg/dL   Calcium 8.5 (L) 8.9 - 10.3 mg/dL   Total Protein 6.7 6.5 - 8.1 g/dL   Albumin 3.8 3.5 - 5.0 g/dL   AST 25 15 - 41 U/L   ALT 17 14 - 54 U/L   Alkaline Phosphatase 73 38 - 126 U/L   Total Bilirubin 1.0 0.3 - 1.2 mg/dL   GFR calc non Af Amer >60 >60 mL/min   GFR calc Af Amer >60 >60 mL/min   Anion gap 5 5 - 15  APTT     Status: None   Collection Time: 04/28/17  5:53 AM  Result Value Ref Range   aPTT 31 24 - 36 seconds  Type and screen If not already done in ED     Status: None   Collection Time: 04/28/17  5:53 AM  Result Value Ref Range   ABO/RH(D) O POS    Antibody Screen NEG    Sample Expiration 05/01/2017     Dg Ankle 2 Views Right  Result Date: 04/27/2017 CLINICAL DATA:  Ankle fracture, post reduction EXAM: RIGHT ANKLE - 2 VIEW COMPARISON:  Ankle radiograph 04/27/2017 FINDINGS: The alignment of the ankle fracture involving both malleoli  is unchanged. There is persistent medial displacement of the talus relative to the ankle mortise. The distal fracture fragment of the right fibula is also displaced laterally. There is circumferential soft tissue swelling. IMPRESSION: Unchanged alignment of the bimalleolar right ankle fracture. Electronically Signed   By: Deatra Robinson M.D.   On: 04/27/2017 23:14   Dg Ankle Complete Right  Result Date: 04/28/2017 CLINICAL DATA:  Bimalleolar RIGHT ankle fracture post ORIF EXAM: RIGHT ANKLE - COMPLETE 3+ VIEW COMPARISON:  04/27/2017 FINDINGS: Bone detail limited by fiberglass cast material. Three cannulated screws present across a reduced fracture of the medial malleolus. Lateral plate and multiple screws present across a reduced fracture of the lateral malleolus. Ankle joint space now normally aligned. No additional fracture, dislocation, or bone destruction identified. IMPRESSION: Post bimalleolar ORIF. Electronically Signed   By: Ulyses Southward M.D.   On: 04/28/2017 12:44   Dg Ankle Complete Right  Result Date: 04/27/2017 CLINICAL DATA:  Patient slipped and fell on right ankle with swelling and bruising. EXAM: RIGHT ANKLE - COMPLETE 3+ VIEW COMPARISON:  None. FINDINGS: Acute, bimalleolar closed fracture-subluxation of the ankle joint is noted with an oblique intra-articular fracture involving the distal fibular metaphysis extending into the ankle joint with 1/2 shaft wdith lateral displacement of the fibular tip. A transverse fracture through the medial malleolus is also noted with widening of the medial clear space of the ankle joint up to 13 mm from lateral subluxation of the talar dome relative to the tibial plafond. Tiny ossific fracture fragments are noted along the anterior and medial aspect of the ankle joint. There is periarticular soft tissues swelling secondary to fracture. The posterior malleolar fracture is not conclusively identified. Calcaneal enthesophytes are seen along the plantar dorsal  aspect. IMPRESSION: 1. Acute, closed, bimalleolar  fracture- lateral subluxation of the ankle joint is noted with an oblique intra-articular fracture involving the distal fibular metaphysis extending into the ankle joint and 1/2 shaft width lateral displacement of the fibular tip. 2. A transverse fracture through the medial malleolus is also noted with widening of the medial clear space of the ankle joint up to 13 mm from lateral subluxation of the talar dome relative to the tibial plafond. 3. No definite posterior malleolar fracture though could be occult due to overlap of osseous elements. 4. Small ankle joint effusion with tiny ossific intra-articular loose bodies noted anteriorly and possibly medially. Electronically Signed   By: Tollie Ethavid  Kwon M.D.   On: 04/27/2017 22:17   Chest Portable 1 View  Result Date: 04/28/2017 CLINICAL DATA:  Preoperative radiograph EXAM: PORTABLE CHEST 1 VIEW COMPARISON:  None. FINDINGS: The heart size and mediastinal contours are within normal limits. Both lungs are clear. The visualized skeletal structures are unremarkable. IMPRESSION: No active disease. Electronically Signed   By: Deatra RobinsonKevin  Herman M.D.   On: 04/28/2017 06:13    Assessment/Plan: Day of Surgery   Active Problems:   Bimalleolar ankle fracture  Patient doing well postop. Continue strict elevation of the right lower extremity. Apply ice to the right ankle to reduce swelling. Continue neurovascular checks and the right lower extremity every 4 hours. Patient will complete 24 hours of postop antibiotics. Patient is nonweightbearing on the right lower extremity. Physical therapy to start tomorrow. Encourage incentive spirometry while awake.  Patient will take enteric-coated aspirin for DVT prophylaxis.    Juanell FairlyKRASINSKI, Derrika Ruffalo , MD 04/28/2017, 1:56 PM

## 2017-04-28 NOTE — H&P (Signed)
Full H&P to follow.   Patient being admitted for displaced right bimalleolar ankle fracture.   She will need surgical fixation.

## 2017-04-28 NOTE — H&P (Signed)
Medical Consult  [facility] - [place] @ [facility] [place], D.O.    Patient Name: Brandi Chang MR#: [id number] Date of Birth: 1929/11/14 Date of Admission: 04/27/2017  Referring MD/NP/PA: Dr. Martha Clan Primary Care Physician: Patient, No Pcp Per  Chief Complaint:  Chief Complaint  Patient presents with  . Ankle Pain    HPI: Brandi Chang is a 83 y.o. female with a known history of OA presents to the emergency department for evaluation of ankle pain s/p mechanical fall.  She was admitted by surgery for bimalleolar fractures. Medical consultation was requested for preoperative evaluation.  Patient denies fevers/chills, weakness, dizziness, chest pain, shortness of breath, N/V/C/D, abdominal pain, dysuria/frequency, changes in mental status.    Otherwise there has been no change in status.  No recent antibiotics.  There has been no recent illness, hospitalizations, travel or sick contacts.    Patient has good functional capacity independent, lives alone with sons who live next door.  She is independent with ADLs, ascends stairs, drives.   Review of Systems:  CONSTITUTIONAL: No fever/chills, fatigue, weakness, weight gain/loss, headache. EYES: No blurry or double vision. ENT: No tinnitus, postnasal drip, redness or soreness of the oropharynx. RESPIRATORY: No cough, dyspnea, wheeze.  No hemoptysis.  CARDIOVASCULAR: No chest pain, palpitations, syncope, orthopnea. No lower extremity edema.  GASTROINTESTINAL: No nausea, vomiting, abdominal pain, diarrhea, constipation.  No hematemesis, melena or hematochezia. GENITOURINARY: No dysuria, frequency, hematuria. ENDOCRINE: No polyuria or nocturia. No heat or cold intolerance. HEMATOLOGY: No anemia, bruising, bleeding. INTEGUMENTARY: No rashes, ulcers, lesions. MUSCULOSKELETAL: Positive ankle pain.  No arthritis, gout, dyspnea. NEUROLOGIC: No numbness, tingling, ataxia, seizure-type activity,  weakness. PSYCHIATRIC: No anxiety, depression, insomnia.   Past Medical History:  Diagnosis Date  . Arthritis   Mild dementia  PSH: Appendectomy in her 30s   reports that she has never smoked. She has never used smokeless tobacco. She reports that she does not drink alcohol or use drugs. Drinks one glass of wine on occasion  No Known Allergies  Family history: Sister with DM, no known cardiac disease  Prior to Admission medications   Medication Sig Start Date End Date Taking? Authorizing Provider  oxyCODONE-acetaminophen (ROXICET) 5-325 MG tablet Take 1 tablet by mouth every 6 (six) hours as needed. 04/27/17 04/27/18  Jeanmarie Plant, MD    Physical Exam: Vitals:   04/27/17 2111 04/27/17 2351  BP: 129/75 139/70  Pulse: 78 78  Resp: 16 18  Temp: 98 F (36.7 C)   TempSrc: Oral   SpO2: 97% 93%  Weight: 54.4 kg (120 lb)   Height: 5\' 2"  (1.575 m)     GENERAL: 83 y.o.-year-old white female patient, well-developed, well-nourished lying in the bed in no acute distress.  Pleasant and cooperative.   HEENT: Head atraumatic, normocephalic. Pupils equal. Mucus membranes moist. NECK: Supple, full range of motion. No JVD, no bruit heard. No thyroid enlargement, no tenderness, no cervical lymphadenopathy. CHEST: Normal breath sounds bilaterally. No wheezing, rales, rhonchi or crackles. No use of accessory muscles of respiration.  No reproducible chest wall tenderness.  CARDIOVASCULAR: S1, S2 normal. SEM at LSB. Cap refill <2 seconds. Pulses intact distally.  ABDOMEN: Soft, nondistended, nontender. No rebound, guarding, rigidity. Normoactive bowel sounds present in all four quadrants. EXTREMITIES: Right ankle splinted. No pedal edema, cyanosis, or clubbing. No calf tenderness or Homan's sign.  NEUROLOGIC: The patient is alert and oriented x 3. Cranial nerves II through XII are grossly intact with no focal sensorimotor deficit. PSYCHIATRIC:  Normal affect, mood, thought content. SKIN: Warm,  dry, and intact without obvious rash, lesion, or ulcer.    Labs on Admission:  CBC:  Recent Labs Lab 04/28/17 0024  WBC 12.6*  HGB 11.8*  HCT 34.8*  MCV 94.1  PLT 253   Basic Metabolic Panel:  Recent Labs Lab 04/28/17 0024  NA 138  K 4.1  CL 103  CO2 27  GLUCOSE 121*  BUN 20  CREATININE 0.56  CALCIUM 8.9   GFR: Estimated Creatinine Clearance: 41.4 mL/min (by C-G formula based on SCr of 0.56 mg/dL). Liver Function Tests: No results for input(s): AST, ALT, ALKPHOS, BILITOT, PROT, ALBUMIN in the last 168 hours. No results for input(s): LIPASE, AMYLASE in the last 168 hours. No results for input(s): AMMONIA in the last 168 hours. Coagulation Profile: No results for input(s): INR, PROTIME in the last 168 hours. Cardiac Enzymes: No results for input(s): CKTOTAL, CKMB, CKMBINDEX, TROPONINI in the last 168 hours. BNP (last 3 results) No results for input(s): PROBNP in the last 8760 hours. HbA1C: No results for input(s): HGBA1C in the last 72 hours. CBG: No results for input(s): GLUCAP in the last 168 hours. Lipid Profile: No results for input(s): CHOL, HDL, LDLCALC, TRIG, CHOLHDL, LDLDIRECT in the last 72 hours. Thyroid Function Tests: No results for input(s): TSH, T4TOTAL, FREET4, T3FREE, THYROIDAB in the last 72 hours. Anemia Panel: No results for input(s): VITAMINB12, FOLATE, FERRITIN, TIBC, IRON, RETICCTPCT in the last 72 hours. Urine analysis: No results found for: COLORURINE, APPEARANCEUR, LABSPEC, PHURINE, GLUCOSEU, HGBUR, BILIRUBINUR, KETONESUR, PROTEINUR, UROBILINOGEN, NITRITE, LEUKOCYTESUR Sepsis Labs: @LABRCNTIP (procalcitonin:4,lacticidven:4) )No results found for this or any previous visit (from the past 240 hour(s)).   Radiological Exams on Admission: Dg Ankle 2 Views Right  Result Date: 04/27/2017 CLINICAL DATA:  Ankle fracture, post reduction EXAM: RIGHT ANKLE - 2 VIEW COMPARISON:  Ankle radiograph 04/27/2017 FINDINGS: The alignment of the ankle  fracture involving both malleoli is unchanged. There is persistent medial displacement of the talus relative to the ankle mortise. The distal fracture fragment of the right fibula is also displaced laterally. There is circumferential soft tissue swelling. IMPRESSION: Unchanged alignment of the bimalleolar right ankle fracture. Electronically Signed   By: Deatra RobinsonKevin  Herman M.D.   On: 04/27/2017 23:14   Dg Ankle Complete Right  Result Date: 04/27/2017 CLINICAL DATA:  Patient slipped and fell on right ankle with swelling and bruising. EXAM: RIGHT ANKLE - COMPLETE 3+ VIEW COMPARISON:  None. FINDINGS: Acute, bimalleolar closed fracture-subluxation of the ankle joint is noted with an oblique intra-articular fracture involving the distal fibular metaphysis extending into the ankle joint with 1/2 shaft wdith lateral displacement of the fibular tip. A transverse fracture through the medial malleolus is also noted with widening of the medial clear space of the ankle joint up to 13 mm from lateral subluxation of the talar dome relative to the tibial plafond. Tiny ossific fracture fragments are noted along the anterior and medial aspect of the ankle joint. There is periarticular soft tissues swelling secondary to fracture. The posterior malleolar fracture is not conclusively identified. Calcaneal enthesophytes are seen along the plantar dorsal aspect. IMPRESSION: 1. Acute, closed, bimalleolar fracture- lateral subluxation of the ankle joint is noted with an oblique intra-articular fracture involving the distal fibular metaphysis extending into the ankle joint and 1/2 shaft width lateral displacement of the fibular tip. 2. A transverse fracture through the medial malleolus is also noted with widening of the medial clear space of the ankle joint up to 13  mm from lateral subluxation of the talar dome relative to the tibial plafond. 3. No definite posterior malleolar fracture though could be occult due to overlap of osseous  elements. 4. Small ankle joint effusion with tiny ossific intra-articular loose bodies noted anteriorly and possibly medially. Electronically Signed   By: Tollie Eth M.D.   On: 04/27/2017 22:17    EKG: Pending  Assessment/Plan  This is a 83 y.o. female with a history of OA, dementia now admitted with:  #. Right bimalleolar ankle fracture - Management per ortho - Bedrest - Pain control - NPO - Routine DVT Px - Check EKG and PT/INR - Check echocardiogram  - Cardiology consult given advanced age. - Note, patient is a Jehovah's witness and would defer blood products.  #. History of OA - Pain control - Early mobilization  All the records are reviewed and case discussed with ED provider. Management plans discussed with the patient and/or family who express understanding and agree with plan of care.  Fergus Throne D.O. on 04/28/2017 at 1:15 AM Between 7am to 6pm - Pager - [phone] After 6pm go to [email or web address] - password [email or web address] [facility]  Hospitalists Office [phone] CC: Primary care physician; Patient, No Pcp Per   04/28/2017, 1:15 AM

## 2017-04-28 NOTE — Progress Notes (Signed)
Admitted this morning for right ankle fracture. EKG looks normal sinus rhythm with no ST T changes. Labs unremarkable. Dr. Darrold JunkerParaschos did not recommend echo  or cardiology evaluation as requested by Dr. Lenord FellersHuglemeyer  Assessment and plan. 83 year old female with right ankle fracture without evidence of EKG changes or pneumonia on chest x-ray with normal labs with normal kidney function.  patient is at moderate risk for surgery due to advanced age  But oterwise  she is medically clear for surgery.d/w Dr.Krasinksi.

## 2017-04-28 NOTE — Progress Notes (Signed)
Pt family notified at 2045. No injuries noted. telesitter in place, bed alarm on and side rails up per family request.

## 2017-04-28 NOTE — Anesthesia Procedure Notes (Signed)
Procedure Name: Intubation Date/Time: 04/28/2017 10:12 AM Performed by: Aline Brochure Pre-anesthesia Checklist: Patient identified, Emergency Drugs available, Suction available and Patient being monitored Patient Re-evaluated:Patient Re-evaluated prior to induction Oxygen Delivery Method: Circle system utilized Preoxygenation: Pre-oxygenation with 100% oxygen Induction Type: IV induction, Rapid sequence and Cricoid Pressure applied Laryngoscope Size: Mac and 3 Grade View: Grade III Tube type: Oral Tube size: 7.0 mm Number of attempts: 1 Airway Equipment and Method: Stylet Placement Confirmation: positive ETCO2 and breath sounds checked- equal and bilateral Secured at: 20 cm Tube secured with: Tape Dental Injury: Teeth and Oropharynx as per pre-operative assessment

## 2017-04-28 NOTE — Progress Notes (Signed)
Pt has telesitter in place for safety, has set off bed alarm a few times thinking she was at home, denies pain. Will continue to monitor.

## 2017-04-28 NOTE — Op Note (Signed)
04/27/2017 - 04/28/2017  12:05 PM  PATIENT:  Brandi Chang    PRE-OPERATIVE DIAGNOSIS:  Displaced right bimalleolar ankle fracture  POST-OPERATIVE DIAGNOSIS:  Same  PROCEDURE:  OPEN REDUCTION INTERNAL FIXATION (ORIF) ANKLE FRACTURE  SURGEON:  Thornton Park, MD  ANESTHESIA:   General  PREOPERATIVE INDICATIONS:  Brandi Chang is a  83 y.o. female with a diagnosis of displaced right bimalleolar ankle fracture who failed conservative measures and elected for surgical management.    I discussed the risks and benefits of surgery. The risks include but are not limited to infection, bleeding requiring blood transfusion, nerve or blood vessel injury, joint stiffness or loss of motion, persistent pain, weakness or instability, malunion, nonunion and hardware failure and the need for further surgery. Medical risks include but are not limited to DVT and pulmonary embolism, myocardial infarction, stroke, pneumonia, respiratory failure and death. Patient understood these risks and wished to proceed.   OPERATIVE IMPLANTS: Synthes 7 hole 1/3 tubular plate for lateral fixation with Synthes 4.91m cannulated screws x 3  OPERATIVE FINDINGS: Displaced bimalleolar ankle fracture with lateral talar subluxation, right ankle  OPERATIVE PROCEDURE:   Patient was met in the preoperative area. The right leg was signed my initials and the word yes according the hospital's correct site of surgery protocol. She was brought to the operating room where she underwent general anesthesia. The patient was placed supine on the operative table. A bump was placed under the right hip. A tourniquet was applied to the right thigh.  The lower extremity was prepped and draped in a sterile fashion. A timeout was performed to verify the patient's name, date of birth, medical record number, correct site of surgery and correct procedure to be performed. It was also used to verify the patient received antibiotics, and that all  appropriate instruments, implants and radiographic studies were available in the room. Once all in attendance were in agreement, the case began.  The right lower extremity was exsanguinated with an Esmarch. The tourniquet was inflated to 275 mmHg. This was applied for a total of 73 minutes. A lateral incision was made over the fibula. The subcutaneous tissues were dissected with the Metzenbaum scissor and pickup. Care was taken to avoid injury to the superficial peroneal nerve. The lateral malleolus fracture was identified and irrigated and fracture hematoma was removed. Soft tissue was removed from the fracture site using a periosteal elevator. A fracture reduction clamp was then used to reduce the fracture to an anatomic position.     The lateral malleolus was then drilled in an AP direction, perpendicular to the fracture site to allow for placement of the lag screw.   A single lag screw, 20 mm in length, was advanced across the fracture site by hand. This compressed the fracture.   A Synthes 7 hole, 1/3 tubular plate was then contoured and placed along the lateral fibula. Four bicortical screws were placed proximal to the fracture and two fully threaded cancellus screws were placed distal the fracture. The fracture reduction and hardware placement were confirmed on AP and lateral imaging.  Once the lateral malleolus was plated, the attention was turned to the medial ankle. A small vertical incision was made over the tip of the medial malleolus.  Soft tissue was dissected with some with the Metzenbaum scissor and pickup. The fracture of the medial malleolus was identified. This was reduced with a dental pick. Three threaded K wires for the 4.0 cannulated screws were then advanced through the tip of  the medial malleolus across the fracture site and into the distal tibia. The position of the K wires was evaluated on AP and lateral FluoroScan images. The length of the wires were measured with a depth gauge and  were determined to be 40, 36 and 34 mm in length. The wires were then overdrilled with a cannulated drill for the 4.0 cannulated screws. The three long threaded 4.0 cannulated screws were then advanced into position by hand, compressing the medial malleolus fracture.    A stress test of the right ankle was then performed under fluoroscopy.  This test did not reveal any syndesmotic injury or opening of the medial clear space.  The medial and lateral incisions were then copiously irrigated. The subcutaneous tissue was closed with 2-0 Vicryl and the skin approximated staples. A dry sterile dressing was applied along with an AO splint. The patient's ankle was positioned in neutral. The pateint was then awoken from anesthesia, transferred to hospital bed and brought to the PACU in stable condition. I was scrubbed and present the entire case and all sharp and instrument counts were correct at conclusion the case. I spoke to the patient's family in the post-op consultation room to let them know the case was performed without complication and the patient was stable in recovery room.    Brandi Gaul, MD

## 2017-04-28 NOTE — Progress Notes (Signed)
Pt fell, confused and thought she was home. No injuries noted. Bed alarm on for safety. MD paged.

## 2017-04-28 NOTE — Clinical Social Work Note (Signed)
CSW received consult for possible SNF placement. CSW will follow pending PT recommendations.  Odilia Damico Martha Tysheka Fanguy, MSW, LCSWA [phone] 

## 2017-04-29 ENCOUNTER — Encounter: Payer: Self-pay | Admitting: Orthopedic Surgery

## 2017-04-29 LAB — URINALYSIS, ROUTINE W REFLEX MICROSCOPIC
BILIRUBIN URINE: NEGATIVE
Bacteria, UA: NONE SEEN
Glucose, UA: NEGATIVE mg/dL
KETONES UR: 5 mg/dL — AB
LEUKOCYTES UA: NEGATIVE
Nitrite: NEGATIVE
Protein, ur: NEGATIVE mg/dL
SPECIFIC GRAVITY, URINE: 1.006 (ref 1.005–1.030)
pH: 7 (ref 5.0–8.0)

## 2017-04-29 LAB — BASIC METABOLIC PANEL
ANION GAP: 9 (ref 5–15)
BUN: 10 mg/dL (ref 6–20)
CALCIUM: 8.3 mg/dL — AB (ref 8.9–10.3)
CO2: 27 mmol/L (ref 22–32)
Chloride: 104 mmol/L (ref 101–111)
Creatinine, Ser: 0.56 mg/dL (ref 0.44–1.00)
GFR calc Af Amer: >60 mL/min (ref >60)
GFR calc non Af Amer: >60 mL/min (ref >60)
GLUCOSE: 125 mg/dL — AB (ref 65–99)
Potassium: 3.6 mmol/L (ref 3.5–5.1)
Sodium: 140 mmol/L (ref 135–145)

## 2017-04-29 LAB — CBC
HEMATOCRIT: 34.3 % — AB (ref 35.0–47.0)
Hemoglobin: 11.8 g/dL — ABNORMAL LOW (ref 12.0–16.0)
MCH: 33.1 pg (ref 26.0–34.0)
MCHC: 34.4 g/dL (ref 32.0–36.0)
MCV: 96.1 fL (ref 80.0–100.0)
Platelets: 220 10*3/uL (ref 150–440)
RBC: 3.57 MIL/uL — ABNORMAL LOW (ref 3.80–5.20)
RDW: 13.7 % (ref 11.5–14.5)
WBC: 10.1 10*3/uL (ref 3.6–11.0)

## 2017-04-29 MED ORDER — LORAZEPAM 1 MG PO TABS
1.0000 mg | ORAL_TABLET | Freq: Four times a day (QID) | ORAL | Status: DC | PRN
  Administered 2017-04-29 – 2017-04-30 (×2): 1 mg via ORAL
  Filled 2017-04-29 (×2): qty 1

## 2017-04-29 MED ORDER — LORAZEPAM 2 MG/ML IJ SOLN
1.0000 mg | Freq: Four times a day (QID) | INTRAMUSCULAR | Status: DC | PRN
Start: 2017-04-29 — End: 2017-04-30
  Administered 2017-04-29 – 2017-04-30 (×3): 1 mg via INTRAMUSCULAR
  Filled 2017-04-29 (×3): qty 1

## 2017-04-29 MED ORDER — ENOXAPARIN SODIUM 40 MG/0.4ML ~~LOC~~ SOLN
40.0000 mg | SUBCUTANEOUS | Status: DC
  Administered 2017-04-29 – 2017-04-30 (×2): 40 mg via SUBCUTANEOUS
  Filled 2017-04-29 (×2): qty 0.4

## 2017-04-29 NOTE — Progress Notes (Signed)
Pt continues to try to get out of bed and pull dressing off her injured foot.

## 2017-04-29 NOTE — Evaluation (Signed)
Physical Therapy Evaluation Patient Details Name: Brandi Chang Renier MRN: [id number] DOB: 1930/02/07 Today's Date: 04/29/2017   History of Present Illness  Pt is a [age] y/o F s/p fall with resultant bimalleolar fractures.  Pt is now s/p R ankle ORIF.  Pt's PMH includes dementia, pt is a TEFL teacherJehovah's witness.     Clinical Impression  Pt admitted with above diagnosis. Pt currently with functional limitations due to the deficits listed below (see PT Problem List). Ms. Lucretia RoersWood was very lethargic and unable to actively contribute to therapeutic exercises, thus PROM R hip and knee were performed.  Pt requires total +2 assist for bed mobility and did not awaken with this. Pt received Ativan this am-per RN.  Anticipating that pt will be able to participate more this afternoon after this has worn off.  Pt will benefit from skilled PT to increase their independence and safety with mobility to allow discharge to the venue listed below.      Follow Up Recommendations SNF    Equipment Recommendations  Other (comment) (TBD at next venue of care)    Recommendations for Other Services       Precautions / Restrictions Precautions Precautions: Fall Restrictions Weight Bearing Restrictions: Yes RLE Weight Bearing: Non weight bearing      Mobility  Bed Mobility Overal bed mobility: Needs Assistance Bed Mobility: Supine to Sit;Sit to Supine     Supine to sit: Total assist;+2 for physical assistance;HOB elevated Sit to supine: Total assist;+2 for physical assistance   General bed mobility comments: Pt requires total assist for all aspects of mobility.  Does not participate in bed mobility.  Use of bed pad for positioning.  Pt keeps her eyes closed even in sitting.  Transfers                 General transfer comment: Unable to assess at this time  Ambulation/Gait                Stairs            Wheelchair Mobility    Modified Rankin (Stroke Patients Only)       Balance  Overall balance assessment: Needs assistance;History of Falls Sitting-balance support: Bilateral upper extremity supported;Feet supported Sitting balance-Leahy Scale: Poor Sitting balance - Comments: Pt requires at least min assist to maintain balance sitting EOB. Pt sat EOB x~2 minutes Postural control: Posterior lean                                   Pertinent Vitals/Pain Pain Assessment: Faces Faces Pain Scale: Hurts even more Pain Location: Groaning and moaning with RLE mobility Pain Descriptors / Indicators: Moaning (groaning) Pain Intervention(s): Limited activity within patient's tolerance;Monitored during session    Home Living Family/patient expects to be discharged to:: Private residence                 Additional Comments: No family present to provide information regarding PLOF or home layout and pt unable to provide information.    Prior Function           Comments: No family present to provide information regarding PLOF or home layout and pt unable to provide information.     Hand Dominance        Extremity/Trunk Assessment   Upper Extremity Assessment Upper Extremity Assessment: Overall WFL for tasks assessed    Lower Extremity Assessment Lower Extremity Assessment: RLE deficits/detail RLE  Deficits / Details: Unable to assess R lower leg.  Pt unable to participate in formal strength testing due to lethargy.  Functionally does not assist with bed mobility.    Cervical / Trunk Assessment Cervical / Trunk Assessment: Kyphotic  Communication   Communication: Other (comment) (pt minimally verbal and unable to answer questions)  Cognition Arousal/Alertness: Lethargic;Suspect due to medications (Pt received Ativan this am-per RN) Behavior During Therapy: Flat affect (Pt does not open her eyes during session) Overall Cognitive Status: History of cognitive impairments - at baseline                                         General Comments General comments (skin integrity, edema, etc.): RN assisted by provided +2 during session.    Exercises General Exercises - Lower Extremity Long Arc Quad: Right;5 reps;Seated;PROM Heel Slides: PROM;Right;10 reps;Supine Hip ABduction/ADduction: PROM;Right;10 reps;Supine Straight Leg Raises: PROM;Right;10 reps;Supine Other Exercises Other Exercises: PROM R hip IR/ER x10 each direction in supine   Assessment/Plan    PT Assessment Patient needs continued PT services  PT Problem List Decreased strength;Decreased range of motion;Decreased activity tolerance;Decreased balance;Decreased mobility;Decreased cognition;Decreased knowledge of use of DME;Decreased safety awareness;Pain;Decreased knowledge of precautions       PT Treatment Interventions DME instruction;Gait training;Functional mobility training;Therapeutic activities;Therapeutic exercise;Balance training;Neuromuscular re-education;Cognitive remediation;Patient/family education;Wheelchair mobility training;Modalities    PT Goals (Current goals can be found in the Care Plan section)  Acute Rehab PT Goals Patient Stated Goal: pt unable to state PT Goal Formulation: Patient unable to participate in goal setting Time For Goal Achievement: 05/13/17 Potential to Achieve Goals: Fair    Frequency BID   Barriers to discharge   Unsure of amount of assist available at d/c    Co-evaluation               AM-PAC PT "6 Clicks" Daily Activity  Outcome Measure Difficulty turning over in bed (including adjusting bedclothes, sheets and blankets)?: Total Difficulty moving from lying on back to sitting on the side of the bed? : Total Difficulty sitting down on and standing up from a chair with arms (e.g., wheelchair, bedside commode, etc,.)?: Total Help needed moving to and from a bed to chair (including a wheelchair)?: Total Help needed walking in hospital room?: Total Help needed climbing 3-5 steps with a railing? :  Total 6 Click Score: 6    End of Session   Activity Tolerance: Patient limited by lethargy Patient left: in bed;with call bell/phone within reach;with bed alarm set;with SCD's reapplied (SCD on LLE.  RLE elevated on pillow) Nurse Communication: Mobility status;Weight bearing status PT Visit Diagnosis: Pain;Unsteadiness on feet (R26.81);History of falling (Z91.81);Muscle weakness (generalized) (M62.81) Pain - Right/Left: Right Pain - part of body: Ankle and joints of foot    Time: 1610-9604 PT Time Calculation (min) (ACUTE ONLY): 22 min   Charges:   PT Evaluation $PT Eval Low Complexity: 1 Procedure     PT G Codes:        Encarnacion Chu PT, DPT 04/29/2017, 9:38 AM

## 2017-04-29 NOTE — Progress Notes (Signed)
Pharmacy note:  Pharmacy consulted to order enoxaparin for DVT ppx. Patient is post-op day #1 s/p ORIF of right ankle fracture.   Will order enoxaparin 40 mg subQ q24h.  Cindi CarbonMary M Kendry Pfarr, PharmD 04/29/17 2:12 PM

## 2017-04-29 NOTE — NC FL2 (Signed)
[place] MEDICAID FL2 LEVEL OF CARE SCREENING TOOL     IDENTIFICATION  Patient Name: Brandi Chang Birthdate: 06-01-1930 Sex: female Admission Date (Current Location): 04/27/2017  [place] and [place] Number:  Chiropodist and Address:  [facility], [address], [place], [place] [address]      Provider Number: [id number]  Attending Physician Name and Address:  Katha Hamming, MD  Relative Name and Phone Number:       Current Level of Care: Hospital Recommended Level of Care: Skilled Nursing Facility Prior Approval Number:    Date Approved/Denied:   PASRR Number:  [id number])  Discharge Plan: SNF    Current Diagnoses: Patient Active Problem List   Diagnosis Date Noted  . Bimalleolar ankle fracture 04/28/2017    Orientation RESPIRATION BLADDER Height & Weight     Self  Normal Continent Weight: 125 lb 14.4 oz (57.1 kg) Height:  5\' 2"  (157.5 cm)  BEHAVIORAL SYMPTOMS/MOOD NEUROLOGICAL BOWEL NUTRITION STATUS   (none)  (none) Continent Diet (Regular Diet )  AMBULATORY STATUS COMMUNICATION OF NEEDS Skin   Extensive Assist Verbally Surgical wounds (Incision: Right Ankle )                       Personal Care Assistance Level of Assistance  Bathing, Feeding, Dressing Bathing Assistance: Limited assistance Feeding assistance: Independent Dressing Assistance: Limited assistance     Functional Limitations Info  Sight, Hearing, Speech Sight Info: Adequate Hearing Info: Adequate Speech Info: Adequate    SPECIAL CARE FACTORS FREQUENCY  PT (By licensed PT), OT (By licensed OT)     PT Frequency:  (5) OT Frequency:  (5)            Contractures      Additional Factors Info  Code Status, Allergies Code Status Info:  (Full Code. ) Allergies Info:  (No Known Allergies. )           Current Medications (04/29/2017):  This is the current hospital active medication list Current Facility-Administered Medications   Medication Dose Route Frequency Provider Last Rate Last Dose  . 0.9 %  sodium chloride infusion  75 mL/hr Intravenous Continuous Juanell Fairly, MD   Stopped at 04/28/17 2030  . acetaminophen (TYLENOL) tablet 650 mg  650 mg Oral Q6H PRN Juanell Fairly, MD       Or  . acetaminophen (TYLENOL) suppository 650 mg  650 mg Rectal Q6H PRN Juanell Fairly, MD      . alum & mag hydroxide-simeth (MAALOX/MYLANTA) 200-200-20 MG/5ML suspension 30 mL  30 mL Oral Q4H PRN Juanell Fairly, MD      . aspirin EC tablet 325 mg  325 mg Oral Q breakfast Juanell Fairly, MD      . bisacodyl (DULCOLAX) suppository 10 mg  10 mg Rectal Daily PRN Juanell Fairly, MD      . docusate sodium (COLACE) capsule 100 mg  100 mg Oral BID Juanell Fairly, MD      . LORazepam (ATIVAN) tablet 1 mg  1 mg Oral Q6H PRN Ihor Austin, MD       Or  . LORazepam (ATIVAN) injection 1 mg  1 mg Intramuscular Q6H PRN Ihor Austin, MD   1 mg at 04/29/17 0513  . magnesium citrate solution 1 Bottle  1 Bottle Oral Once PRN Juanell Fairly, MD      . menthol-cetylpyridinium (CEPACOL) lozenge 3 mg  1 lozenge Oral PRN Juanell Fairly, MD  Or  . phenol (CHLORASEPTIC) mouth spray 1 spray  1 spray Mouth/Throat PRN Juanell FairlyKrasinski, Kevin, MD      . methocarbamol (ROBAXIN) tablet 500 mg  500 mg Oral Q6H PRN Juanell FairlyKrasinski, Kevin, MD       Or  . methocarbamol (ROBAXIN) 500 mg in dextrose 5 % 50 mL IVPB  500 mg Intravenous Q6H PRN Juanell FairlyKrasinski, Kevin, MD      . ondansetron [facility](ZOFRAN) tablet 4 mg  4 mg Oral Q6H PRN Juanell FairlyKrasinski, Kevin, MD       Or  . ondansetron [facility](ZOFRAN) injection 4 mg  4 mg Intravenous Q6H PRN Juanell FairlyKrasinski, Kevin, MD      . polyethylene glycol (MIRALAX / GLYCOLAX) packet 17 g  17 g Oral Daily PRN Juanell FairlyKrasinski, Kevin, MD      . senna Mancel Parsons(SENOKOT) tablet 8.6 mg  1 tablet Oral BID Juanell FairlyKrasinski, Kevin, MD         Discharge Medications: Please see discharge summary for a list of discharge medications.  Relevant Imaging Results:  Relevant Lab  Results:   Additional Information  (SSN: [id number])  Ellaina Schuler, Darleen CrockerBailey M, LCSW

## 2017-04-29 NOTE — Progress Notes (Signed)
Physical Therapy Treatment Patient Details Name: Brandi Chang MRN: [id number] DOB: 07/31/1930 Today's Date: 04/29/2017    History of Present Illness Pt is a 83 y/o F s/p fall with resultant bimalleolar fractures.  Pt is now s/p R ankle ORIF.  Pt's PMH includes dementia, pt is a TEFL teacherJehovah's witness.     PT Comments    Brandi Chang was much more alert this afternoon but still does not show understanding of NWB RLE, thus OOB mobility deferred at this time.  She was able to complete RLE AAROM and AROM therapeutic exercises this session, although she remains minimally communicative.  She was very pleasant during the session but experiences some pain when RLE in dependent position, grimacing and moaning.  Pt will benefit from continued skilled PT services to increase functional independence and safety.  SNF remains most appropriate plan at this time.    Follow Up Recommendations  SNF     Equipment Recommendations  Other (comment) (TBD at next venue of care)    Recommendations for Other Services       Precautions / Restrictions Precautions Precautions: Fall Restrictions Weight Bearing Restrictions: Yes RLE Weight Bearing: Non weight bearing    Mobility  Bed Mobility Overal bed mobility: Needs Assistance Bed Mobility: Supine to Sit;Sit to Supine     Supine to sit: HOB elevated;Mod assist Sit to supine: Max assist   General bed mobility comments: Cues provided to pt for sequencing, pt requires assist advancing LEs to EOB and assist to push up into sitting.  To return to supine pt requires max assist and is unable to correctly direct trunk due to poor abdominal strength.  Transfers                 General transfer comment: Not safe to attempt at this time as pt does not have an understanding of NWB RLE.   Ambulation/Gait                 Stairs            Wheelchair Mobility    Modified Rankin (Stroke Patients Only)       Balance Overall balance  assessment: Needs assistance;History of Falls Sitting-balance support: Bilateral upper extremity supported;Feet supported Sitting balance-Leahy Scale: Poor Sitting balance - Comments: Pt relies on UE support when seated EOB with min guard assist.  Pt sat EOB for ~5 minutes.                                    Cognition Arousal/Alertness: Awake/alert Behavior During Therapy: Flat affect Overall Cognitive Status: History of cognitive impairments - at baseline                                        Exercises General Exercises - Lower Extremity Long Arc Quad: Right;Seated;AROM;10 reps Heel Slides: Right;10 reps;Supine;AAROM Hip ABduction/ADduction: Right;10 reps;Supine;AROM Straight Leg Raises: Right;10 reps;Supine;AROM Hip Flexion/Marching: Other (comment) (attempted but pt unable to follow commands/demonstration) Other Exercises Other Exercises: PROM R hip IR/ER x10 each direction in supine    General Comments        Pertinent Vitals/Pain Pain Assessment: Faces Faces Pain Scale: Hurts even more Pain Location: Moaning when RLE in dependent position Pain Descriptors / Indicators: Moaning;Grimacing Pain Intervention(s): Limited activity within patient's tolerance;Monitored during session    Home  Living                      Prior Function            PT Goals (current goals can now be found in the care plan section) Acute Rehab PT Goals Patient Stated Goal: pt unable to state PT Goal Formulation: Patient unable to participate in goal setting Time For Goal Achievement: 05/13/17 Potential to Achieve Goals: Fair Progress towards PT goals: Progressing toward goals    Frequency    BID      PT Plan Current plan remains appropriate    Co-evaluation              AM-PAC PT "6 Clicks" Daily Activity  Outcome Measure  Difficulty turning over in bed (including adjusting bedclothes, sheets and blankets)?: Total Difficulty moving  from lying on back to sitting on the side of the bed? : Total Difficulty sitting down on and standing up from a chair with arms (e.g., wheelchair, bedside commode, etc,.)?: Total Help needed moving to and from a bed to chair (including a wheelchair)?: Total Help needed walking in hospital room?: Total Help needed climbing 3-5 steps with a railing? : Total 6 Click Score: 6    End of Session   Activity Tolerance: Patient tolerated treatment well;Patient limited by fatigue Patient left: in bed;with call bell/phone within reach;with bed alarm set;with SCD's reapplied;with family/visitor present (SCD on LLE.  RLE elevated on pillow.  Pt eating lunch.) Nurse Communication: Mobility status;Weight bearing status PT Visit Diagnosis: Pain;Unsteadiness on feet (R26.81);History of falling (Z91.81);Muscle weakness (generalized) (M62.81) Pain - Right/Left: Right Pain - part of body: Ankle and joints of foot     Time: 1431-1451 PT Time Calculation (min) (ACUTE ONLY): 20 min  Charges:  $Therapeutic Exercise: 8-22 mins                    G Codes:       Encarnacion Chu PT, DPT 04/29/2017, 3:58 PM

## 2017-04-29 NOTE — Progress Notes (Signed)
Urine specimen collected and sent to lab.  Suzan SlickAlison L Anesa Fronek, RN

## 2017-04-29 NOTE — Progress Notes (Signed)
OT Cancellation Note  Patient Details Name: Brandi Chang MRN: [id number] DOB: October 30, 1929   Cancelled Treatment:    Reason Eval/Treat Not Completed: Fatigue/lethargy limiting ability to participate  Olegario MessierElaine Denaja Verhoeven, MS, OTR/L 04/29/2017, 11:20 AM

## 2017-04-29 NOTE — Progress Notes (Signed)
[facility] [place] [facility]   PATIENT NAME: Brandi Chang    MR#:  [id number]  DATE OF BIRTH:  [birth year]  SUBJECTIVE: Admitted for right ankle fracture status post repair yesterday. Very confused last night and had a fall last night, trying to get out of bed.. Monitor electrolytes and station. While sleepy this morning.   CHIEF COMPLAINT:   Chief Complaint  Patient presents with  . Ankle Pain    REVIEW OF SYSTEMS:    Review of Systems  Unable to perform ROS: Dementia    Nutrition:  Tolerating Diet: Tolerating PT:      DRUG ALLERGIES:  No Known Allergies  VITALS:  Blood pressure 138/65, pulse 75, temperature 99 F (37.2 C), resp. rate 18, height 5\' 2"  (1.575 m), weight 57.1 kg (125 lb 14.4 oz), SpO2 98 %.  PHYSICAL EXAMINATION:   Physical Exam  GENERAL:  83 y.o.-year-old patient lying in the bed ,sleepy, EYES: Pupils equal, round, reactive to light.  HEENT: Head atraumatic, normocephalic. Oropharynx and nasopharynx clear.  NECK:  Supple, no jugular venous distention. No thyroid enlargement, no tenderness.  LUNGS: Normal breath sounds bilaterally, no wheezing, rales,rhonchi or crepitation. No use of accessory muscles of respiration.  CARDIOVASCULAR: S1, S2 normal. No murmurs, rubs, or gallops.  ABDOMEN: Soft, nontender, nondistended. Bowel sounds present. No organomegaly or mass.  EXTREMITIES: No pedal edema, cyanosis, or clubbing.  NEUROLOGIC: Patient sedated. Received Ativan because of her agitation.  PSYCHIATRIC: The patient is alert and oriented x 3.  SKIN: No obvious rash, lesion, or ulcer.    LABORATORY PANEL:   CBC  Recent Labs Lab 04/29/17 0412  WBC 10.1  HGB 11.8*  HCT 34.3*  PLT 220   ------------------------------------------------------------------------------------------------------------------  Chemistries   Recent Labs Lab 04/28/17 0553 04/29/17 0412  NA 137 140  K 4.2 3.6  CL 104 104  CO2 28 27   GLUCOSE 108* 125*  BUN 21* 10  CREATININE 0.47 0.56  CALCIUM 8.5* 8.3*  AST 25  --   ALT 17  --   ALKPHOS 73  --   BILITOT 1.0  --    ------------------------------------------------------------------------------------------------------------------  Cardiac Enzymes No results for input(s): TROPONINI in the last 168 hours. ------------------------------------------------------------------------------------------------------------------  RADIOLOGY:  Dg Ankle 2 Views Right  Result Date: 04/27/2017 CLINICAL DATA:  Ankle fracture, post reduction EXAM: RIGHT ANKLE - 2 VIEW COMPARISON:  Ankle radiograph 04/27/2017 FINDINGS: The alignment of the ankle fracture involving both malleoli is unchanged. There is persistent medial displacement of the talus relative to the ankle mortise. The distal fracture fragment of the right fibula is also displaced laterally. There is circumferential soft tissue swelling. IMPRESSION: Unchanged alignment of the bimalleolar right ankle fracture. Electronically Signed   By: Deatra Robinson M.D.   On: 04/27/2017 23:14   Dg Ankle Complete Right  Result Date: 04/28/2017 CLINICAL DATA:  Bimalleolar RIGHT ankle fracture post ORIF EXAM: RIGHT ANKLE - COMPLETE 3+ VIEW COMPARISON:  04/27/2017 FINDINGS: Bone detail limited by fiberglass cast material. Three cannulated screws present across a reduced fracture of the medial malleolus. Lateral plate and multiple screws present across a reduced fracture of the lateral malleolus. Ankle joint space now normally aligned. No additional fracture, dislocation, or bone destruction identified. IMPRESSION: Post bimalleolar ORIF. Electronically Signed   By: Ulyses Southward M.D.   On: 04/28/2017 12:44   Dg Ankle Complete Right  Result Date: 04/27/2017 CLINICAL DATA:  Patient slipped and fell on right ankle with swelling and bruising. EXAM:  RIGHT ANKLE - COMPLETE 3+ VIEW COMPARISON:  None. FINDINGS: Acute, bimalleolar closed fracture-subluxation of  the ankle joint is noted with an oblique intra-articular fracture involving the distal fibular metaphysis extending into the ankle joint with 1/2 shaft wdith lateral displacement of the fibular tip. A transverse fracture through the medial malleolus is also noted with widening of the medial clear space of the ankle joint up to 13 mm from lateral subluxation of the talar dome relative to the tibial plafond. Tiny ossific fracture fragments are noted along the anterior and medial aspect of the ankle joint. There is periarticular soft tissues swelling secondary to fracture. The posterior malleolar fracture is not conclusively identified. Calcaneal enthesophytes are seen along the plantar dorsal aspect. IMPRESSION: 1. Acute, closed, bimalleolar fracture- lateral subluxation of the ankle joint is noted with an oblique intra-articular fracture involving the distal fibular metaphysis extending into the ankle joint and 1/2 shaft width lateral displacement of the fibular tip. 2. A transverse fracture through the medial malleolus is also noted with widening of the medial clear space of the ankle joint up to 13 mm from lateral subluxation of the talar dome relative to the tibial plafond. 3. No definite posterior malleolar fracture though could be occult due to overlap of osseous elements. 4. Small ankle joint effusion with tiny ossific intra-articular loose bodies noted anteriorly and possibly medially. Electronically Signed   By: Tollie Ethavid  Kwon M.D.   On: 04/27/2017 22:17   Chest Portable 1 View  Result Date: 04/28/2017 CLINICAL DATA:  Preoperative radiograph EXAM: PORTABLE CHEST 1 VIEW COMPARISON:  None. FINDINGS: The heart size and mediastinal contours are within normal limits. Both lungs are clear. The visualized skeletal structures are unremarkable. IMPRESSION: No active disease. Electronically Signed   By: Deatra RobinsonKevin  Herman M.D.   On: 04/28/2017 06:13     ASSESSMENT AND PLAN:   Active Problems:   Bimalleolar ankle  fracture    right Bimalleolar ankle fracture'status post repair. Continue physical therapy, nonweightbearing to right leg as tolerated, continue DVT prophylaxis. Postoperative course complicated by her dementia getting worse requiring Ativan and tele sitterr also,. Discussed with patient's son, leaning towards nursing home placement.  Continue incentive spirometry. Check UA for evidence of possible UTI.  All the records are reviewed and case discussed with Care Management/Social Workerr. Management plans discussed with the patient, family and they are in agreement.  CODE STATUS:full  TOTAL TIME TAKING CARE OF THIS PATIENT: 35 minutes.   POSSIBLE D/C IN 1-2DAYS, DEPENDING ON CLINICAL CONDITION.   Katha HammingKONIDENA,Redding Cloe M.D on 04/29/2017 at 11:09 AM  Between 7am to 6pm - Pager - [phone]  After 6pm go to [email or web address] - password [email or web address]  [facility] [place] [facility]  Office  [phone]  CC: Primary care physician; Patient, No Pcp Per

## 2017-04-29 NOTE — Progress Notes (Signed)
Subjective:  Postoperative day #1 status post ORIF of right bimalleolar ankle fracture. A patient's nurse reports that the patient had major sundowning and agitation overnight. Patient fell out of bed.  Patient is currently sleeping. I have decided not to wake the patient as she needs rest.   Objective:   VITALS:   Vitals:   04/28/17 1732 04/28/17 1747 04/28/17 2034 04/28/17 2228  BP: (!) 165/71  (!) 141/64 138/65  Pulse: (!) 57  69 75  Resp:    18  Temp: 98.6 F (37 C) 98.1 F (36.7 C) 99.2 F (37.3 C) 99 F (37.2 C)  TempSrc:  Oral Oral   SpO2: 98%  97% 98%  Weight:      Height:        PHYSICAL EXAM: Right lower extremity: Patient's dressing and splint are on and clean and dry. Her toes are well-perfused.   LABS  Results for orders placed or performed during the hospital encounter of 04/27/17 (from the past 24 hour(s))  CBC     Status: Abnormal   Collection Time: 04/29/17  4:12 AM  Result Value Ref Range   WBC 10.1 3.6 - 11.0 K/uL   RBC 3.57 (L) 3.80 - 5.20 MIL/uL   Hemoglobin 11.8 (L) 12.0 - 16.0 g/dL   HCT 16.1 (L) 09.6 - 04.5 %   MCV 96.1 80.0 - 100.0 fL   MCH 33.1 26.0 - 34.0 pg   MCHC 34.4 32.0 - 36.0 g/dL   RDW 40.9 81.1 - 91.4 %   Platelets 220 150 - 440 K/uL  Basic metabolic panel     Status: Abnormal   Collection Time: 04/29/17  4:12 AM  Result Value Ref Range   Sodium 140 135 - 145 mmol/L   Potassium 3.6 3.5 - 5.1 mmol/L   Chloride 104 101 - 111 mmol/L   CO2 27 22 - 32 mmol/L   Glucose, Bld 125 (H) 65 - 99 mg/dL   BUN 10 6 - 20 mg/dL   Creatinine, Ser 7.82 0.44 - 1.00 mg/dL   Calcium 8.3 (L) 8.9 - 10.3 mg/dL   GFR calc non Af Amer >60 >60 mL/min   GFR calc Af Amer >60 >60 mL/min   Anion gap 9 5 - 15    Dg Ankle 2 Views Right  Result Date: 04/27/2017 CLINICAL DATA:  Ankle fracture, post reduction EXAM: RIGHT ANKLE - 2 VIEW COMPARISON:  Ankle radiograph 04/27/2017 FINDINGS: The alignment of the ankle fracture involving both malleoli is  unchanged. There is persistent medial displacement of the talus relative to the ankle mortise. The distal fracture fragment of the right fibula is also displaced laterally. There is circumferential soft tissue swelling. IMPRESSION: Unchanged alignment of the bimalleolar right ankle fracture. Electronically Signed   By: Deatra Robinson M.D.   On: 04/27/2017 23:14   Dg Ankle Complete Right  Result Date: 04/28/2017 CLINICAL DATA:  Bimalleolar RIGHT ankle fracture post ORIF EXAM: RIGHT ANKLE - COMPLETE 3+ VIEW COMPARISON:  04/27/2017 FINDINGS: Bone detail limited by fiberglass cast material. Three cannulated screws present across a reduced fracture of the medial malleolus. Lateral plate and multiple screws present across a reduced fracture of the lateral malleolus. Ankle joint space now normally aligned. No additional fracture, dislocation, or bone destruction identified. IMPRESSION: Post bimalleolar ORIF. Electronically Signed   By: Ulyses Southward M.D.   On: 04/28/2017 12:44   Dg Ankle Complete Right  Result Date: 04/27/2017 CLINICAL DATA:  Patient slipped and fell on right ankle with  swelling and bruising. EXAM: RIGHT ANKLE - COMPLETE 3+ VIEW COMPARISON:  None. FINDINGS: Acute, bimalleolar closed fracture-subluxation of the ankle joint is noted with an oblique intra-articular fracture involving the distal fibular metaphysis extending into the ankle joint with 1/2 shaft wdith lateral displacement of the fibular tip. A transverse fracture through the medial malleolus is also noted with widening of the medial clear space of the ankle joint up to 13 mm from lateral subluxation of the talar dome relative to the tibial plafond. Tiny ossific fracture fragments are noted along the anterior and medial aspect of the ankle joint. There is periarticular soft tissues swelling secondary to fracture. The posterior malleolar fracture is not conclusively identified. Calcaneal enthesophytes are seen along the plantar dorsal aspect.  IMPRESSION: 1. Acute, closed, bimalleolar fracture- lateral subluxation of the ankle joint is noted with an oblique intra-articular fracture involving the distal fibular metaphysis extending into the ankle joint and 1/2 shaft width lateral displacement of the fibular tip. 2. A transverse fracture through the medial malleolus is also noted with widening of the medial clear space of the ankle joint up to 13 mm from lateral subluxation of the talar dome relative to the tibial plafond. 3. No definite posterior malleolar fracture though could be occult due to overlap of osseous elements. 4. Small ankle joint effusion with tiny ossific intra-articular loose bodies noted anteriorly and possibly medially. Electronically Signed   By: Tollie Ethavid  Kwon M.D.   On: 04/27/2017 22:17   Chest Portable 1 View  Result Date: 04/28/2017 CLINICAL DATA:  Preoperative radiograph EXAM: PORTABLE CHEST 1 VIEW COMPARISON:  None. FINDINGS: The heart size and mediastinal contours are within normal limits. Both lungs are clear. The visualized skeletal structures are unremarkable. IMPRESSION: No active disease. Electronically Signed   By: Deatra RobinsonKevin  Herman M.D.   On: 04/28/2017 06:13    Assessment/Plan: 1 Day Post-Op   Active Problems:   Bimalleolar ankle fracture  Patient will continue elevation of the right lower extremity. Ice may be applied as needed. She will be ordered for Lovenox for DVT prophylaxis. Continue physical therapy when appropriate. She will be nonweightbearing on her right lower extremity for 6 weeks postop.    Juanell FairlyKRASINSKI, Dillinger Aston , MD 04/29/2017, 1:32 PM

## 2017-04-29 NOTE — Clinical Social Work Placement (Signed)
   CLINICAL SOCIAL WORK PLACEMENT  NOTE  Date:  04/29/2017  Patient Details  Name: Brandi Chang MRN: [id number] Date of Birth: [birth year]  Clinical Social Work is seeking post-discharge placement for this patient at the Skilled  Nursing Facility level of care (*CSW will initial, date and re-position this form in  chart as items are completed):  Yes   Patient/family provided with [facility] Clinical Social Work Department's list of facilities offering this level of care within the geographic area requested by the patient (or if unable, by the patient's family).  Yes   Patient/family informed of their freedom to choose among providers that offer the needed level of care, that participate in Medicare, Medicaid or managed care program needed by the patient, have an available bed and are willing to accept the patient.  Yes   Patient/family informed of [facility]'s ownership interest in [facility] and [facility], as well as of the fact that they are under no obligation to receive care at these facilities.  PASRR submitted to EDS on 04/29/17     PASRR number received on 04/29/17     Existing PASRR number confirmed on       FL2 transmitted to all facilities in geographic area requested by pt/family on 04/29/17     FL2 transmitted to all facilities within larger geographic area on       Patient informed that his/her managed care company has contracts with or will negotiate with certain facilities, including the following:            Patient/family informed of bed offers received.  Patient chooses bed at       Physician recommends and patient chooses bed at      Patient to be transferred to   on  .  Patient to be transferred to facility by       Patient family notified on   of transfer.  Name of family member notified:        PHYSICIAN       Additional Comment:    _______________________________________________ Tisheena Maguire, Brandi CrockerBailey M, LCSW 04/29/2017, 3:00 PM

## 2017-04-29 NOTE — Clinical Social Work Note (Signed)
Clinical Social Work Assessment  Patient Details  Name: Brandi Chang MRN: [id number] Date of Birth: 05-11-1930  Date of referral:  04/29/17               Reason for consult:  Facility Placement                Permission sought to share information with:  Chartered certified accountant granted to share information::  Yes, Verbal Permission Granted  Name::      [place]::   [place]   Relationship::     Contact Information:     Housing/Transportation Living arrangements for the past 2 months:  Single Family Home Source of Information:  Friend/Neighbor Patient Interpreter Needed:  None Criminal Activity/Legal Involvement Pertinent to Current Situation/Hospitalization:  No - Comment as needed Significant Relationships:  Adult Children, Other Family Members Lives with:  Self Do you feel safe going back to the place where you live?    Need for family participation in patient care:  Yes (Comment)  Care giving concerns:  Patient lives alone in [place], [place].    Social Worker assessment / plan:  Holiday representative (CSW) reviewed chart and noted that PT is recommending SNF. Patient has a tele-sitter CSW made RN aware that patient will have to be without the tele-sitter for 24 hours prior to D/C to SNF. CSW met with patient and her friend Brandi Chang was at bedside. Patient was pleasantly confused and did not participate in assessment. Per Brandi Chang patient lives alone and her 2 sons Alvester Chou and Quita Skye check on her often. Per Brandi Chang patient's daughter in laws are also very supportive. CSW discussed SNF options. Brandi Chang is agreeable to SNF search and stated that patient's son Alvester Chou and Quita Skye will decide which SNF. CSW attempted to contact Alvester Chou and briefly spoke to his wife. Per Barry's wife Quita Skye and Alvester Chou are not available to come to the phone now and she will have Alvester Chou call [facility] back. CSW will continue to follow and assist as needed.   Employment status:   Disabled (Comment on whether or not currently receiving Disability) Insurance information:  Medicare PT Recommendations:  [place] / Referral to community resources:  [place]  Patient/Family's Response to care:  CSW is waiting on patient's son Alvester Chou to call CSW back.   Patient/Family's Understanding of and Emotional Response to Diagnosis, Current Treatment, and Prognosis: Patient was pleasantly confused.   Emotional Assessment Appearance:  Appears stated age Attitude/Demeanor/Rapport:  Unable to Assess Affect (typically observed):  Unable to Assess Orientation:  Oriented to Self, Fluctuating Orientation (Suspected and/or reported Sundowners) Alcohol / Substance use:  Not Applicable Psych involvement (Current and /or in the community):  No (Comment)  Discharge Needs  Concerns to be addressed:  Discharge Planning Concerns Readmission within the last 30 days:  No Current discharge risk:  Dependent with Mobility Barriers to Discharge:  Continued Medical Work up   [employer], Veronia Beets, LCSW 04/29/2017, 3:01 PM

## 2017-04-29 NOTE — Progress Notes (Signed)
Patient's son Gery PrayBarry called Visual merchandiserClinical Social Worker (CSW) back and chose [facility]. [facility] at [facility] is aware of accepted bed offer.   Baker Hughes IncorporatedBailey Neema Barreira, LCSW [phone]

## 2017-04-30 ENCOUNTER — Inpatient Hospital Stay
Admit: 2017-04-30 | Discharge: 2017-04-30 | Disposition: A | Discharge status: Home / Self Care | Payer: Medicare Other | Attending: Family Medicine | Admitting: Family Medicine

## 2017-04-30 LAB — CBC
HCT: 35.3 % (ref 35.0–47.0)
HEMOGLOBIN: 12.1 g/dL (ref 12.0–16.0)
MCH: 33.1 pg (ref 26.0–34.0)
MCHC: 34.3 g/dL (ref 32.0–36.0)
MCV: 96.3 fL (ref 80.0–100.0)
PLATELETS: 221 10*3/uL (ref 150–440)
RBC: 3.66 MIL/uL — AB (ref 3.80–5.20)
RDW: 13.7 % (ref 11.5–14.5)
WBC: 9.7 10*3/uL (ref 3.6–11.0)

## 2017-04-30 LAB — ECHOCARDIOGRAM COMPLETE
HEIGHTINCHES: 62 in
Weight: 2014.4 oz

## 2017-04-30 LAB — BASIC METABOLIC PANEL
Anion gap: 10 (ref 5–15)
BUN: 18 mg/dL (ref 6–20)
CHLORIDE: 100 mmol/L — AB (ref 101–111)
CO2: 27 mmol/L (ref 22–32)
Calcium: 8.5 mg/dL — ABNORMAL LOW (ref 8.9–10.3)
Creatinine, Ser: 0.6 mg/dL (ref 0.44–1.00)
GFR calc Af Amer: >60 mL/min (ref >60)
Glucose, Bld: 106 mg/dL — ABNORMAL HIGH (ref 65–99)
POTASSIUM: 3.5 mmol/L (ref 3.5–5.1)
SODIUM: 137 mmol/L (ref 135–145)

## 2017-04-30 MED ORDER — HALOPERIDOL LACTATE 5 MG/ML IJ SOLN
1.0000 mg | Freq: Four times a day (QID) | INTRAMUSCULAR | Status: DC | PRN
  Administered 2017-04-30 – 2017-05-01 (×2): 1 mg via INTRAVENOUS
  Filled 2017-04-30 (×2): qty 1

## 2017-04-30 MED ORDER — METHOCARBAMOL 500 MG PO TABS: 500.0000 mg | ORAL_TABLET | Freq: Four times a day (QID) | ORAL | 0 refills | Status: DC | PRN

## 2017-04-30 MED ORDER — DOCUSATE SODIUM 100 MG PO CAPS: 100.0000 mg | ORAL_CAPSULE | Freq: Two times a day (BID) | ORAL | 0 refills | Status: DC

## 2017-04-30 MED ORDER — QUETIAPINE FUMARATE 25 MG PO TABS
25.0000 mg | ORAL_TABLET | Freq: Every day | ORAL | Status: DC
  Administered 2017-04-30: 25 mg via ORAL
  Filled 2017-04-30: qty 1

## 2017-04-30 MED ORDER — LORAZEPAM 1 MG PO TABS: 1.0000 mg | ORAL_TABLET | Freq: Four times a day (QID) | ORAL | 0 refills | Status: DC | PRN

## 2017-04-30 NOTE — Discharge Summary (Addendum)
Brandi Chang, is a 83 y.o. female  DOB Feb 10, 1930  MRN [id number].  Admission date:  04/27/2017  Admitting Physician  Juanell FairlyKevin Krasinski, MD  Discharge Date:  05/01/2017   Primary MD  Patient, No Pcp Per  Recommendations for primary care physician for things to follow:   Follow-up with Dr. Juanell FairlyKevin Krasinski in 4 weeks.   Admission Diagnosis  Right ankle injury [S99.911A] Closed bimalleolar fracture of right ankle, initial encounter [S82.841A]   Discharge Diagnosis  Right ankle injury [S99.911A] Closed bimalleolar fracture of right ankle, initial encounter [S82.841A]    Active Problems:   Bimalleolar ankle fracture    Dementia with inpatient delirium  Past Medical History:  Diagnosis Date  . Arthritis     Past Surgical History:  Procedure Laterality Date  . ORIF ANKLE FRACTURE Right 04/28/2017   Procedure: OPEN REDUCTION INTERNAL FIXATION (ORIF) ANKLE FRACTURE;  Surgeon: Juanell FairlyKrasinski, Kevin, MD;  Location: [facility] ORS;  Service: Orthopedics;  Laterality: Right;       History of present illness and  Hospital Course:     Kindly see H&P for history of present illness and admission details, please review complete Labs, Consult reports and Test reports for all details in brief  HPI  from the history and physical done on the day of admission  83 year old female with dementia admitted for fall, found to have a right ankle fracture.  Hospital Course  #1 Right ankle bimalleolar fracture with distal or subluxation seen by Dr. Juanell FairlyKevin Krasinski, patient had operative repair on July 22 with open reduction, internal fixation. Patient tolerated the procedure well however had postop confusion due to her dementia and sundowning. We discontinued narcotics, use Tylenol for pain control. Physical therapy saw the patient, recommended  skilled nursing. Patient is nonweightbearing to the right leg for 6 weeks, follow-up with Dr. Martha ClanKrasinski as an outpatient.  Lovenox for 12 more days after discharge for DVT prophylaxis. Total 14 days post-op.  #2 Dementia with sundowning and inpatient delirium: Agitation episodes in the hospital: Urinalysis normal without evidence of infection, no fever. Normal kidney function, normal white count. Patient is better today. Use Ativan as needed and for agitation.   [name] at discharge.  Discharge Condition: stable  Follow UP   Contact information for follow-up providers    Juanell FairlyKrasinski, Kevin, MD. Schedule an appointment as soon as possible for a visit in 2 days.   Specialty:  Orthopedic Surgery Contact information: [address] [place] [address] [phone]            Contact information for after-discharge care    Destination    [name] .   Specialty:  Skilled Nursing Facility Contact information: [address] [place] [id number] [phone]                    Discharge Instructions  and  Discharge Medications      Allergies as of 05/01/2017   No Known Allergies     Medication List    TAKE these medications   docusate sodium 100 MG capsule Commonly known as:  COLACE Take 1 capsule (100 mg total) by mouth 2 (two) times daily.   enoxaparin 40 MG/0.4ML injection Commonly known as:  LOVENOX Inject 0.4 mLs (40 mg total) into the skin daily.   LORazepam 1 MG tablet Commonly known as:  ATIVAN Take 1 tablet (1 mg total) by mouth every 6 (six) hours as needed for anxiety (agitation).  meloxicam 15 MG tablet Commonly known as:  MOBIC Take 1 tablet by mouth daily.   methocarbamol 500 MG tablet Commonly known as:  ROBAXIN Take 1 tablet (500 mg total) by mouth every 6 (six) hours as needed for muscle spasms.         Diet and Activity recommendation: See Discharge Instructions  above   Consults obtained - ortho   Major procedures and Radiology Reports - PLEASE review detailed and final reports for all details, in brief -    Dg Ankle 2 Views Right  Result Date: 04/27/2017 CLINICAL DATA:  Ankle fracture, post reduction EXAM: RIGHT ANKLE - 2 VIEW COMPARISON:  Ankle radiograph 04/27/2017 FINDINGS: The alignment of the ankle fracture involving both malleoli is unchanged. There is persistent medial displacement of the talus relative to the ankle mortise. The distal fracture fragment of the right fibula is also displaced laterally. There is circumferential soft tissue swelling. IMPRESSION: Unchanged alignment of the bimalleolar right ankle fracture. Electronically Signed   By: Deatra Robinson M.D.   On: 04/27/2017 23:14   Dg Ankle Complete Right  Result Date: 04/28/2017 CLINICAL DATA:  Bimalleolar RIGHT ankle fracture post ORIF EXAM: RIGHT ANKLE - COMPLETE 3+ VIEW COMPARISON:  04/27/2017 FINDINGS: Bone detail limited by fiberglass cast material. Three cannulated screws present across a reduced fracture of the medial malleolus. Lateral plate and multiple screws present across a reduced fracture of the lateral malleolus. Ankle joint space now normally aligned. No additional fracture, dislocation, or bone destruction identified. IMPRESSION: Post bimalleolar ORIF. Electronically Signed   By: Ulyses Southward M.D.   On: 04/28/2017 12:44   Dg Ankle Complete Right  Result Date: 04/27/2017 CLINICAL DATA:  Patient slipped and fell on right ankle with swelling and bruising. EXAM: RIGHT ANKLE - COMPLETE 3+ VIEW COMPARISON:  None. FINDINGS: Acute, bimalleolar closed fracture-subluxation of the ankle joint is noted with an oblique intra-articular fracture involving the distal fibular metaphysis extending into the ankle joint with 1/2 shaft wdith lateral displacement of the fibular tip. A transverse fracture through the medial malleolus is also noted with widening of the medial clear space of the  ankle joint up to 13 mm from lateral subluxation of the talar dome relative to the tibial plafond. Tiny ossific fracture fragments are noted along the anterior and medial aspect of the ankle joint. There is periarticular soft tissues swelling secondary to fracture. The posterior malleolar fracture is not conclusively identified. Calcaneal enthesophytes are seen along the plantar dorsal aspect. IMPRESSION: 1. Acute, closed, bimalleolar fracture- lateral subluxation of the ankle joint is noted with an oblique intra-articular fracture involving the distal fibular metaphysis extending into the ankle joint and 1/2 shaft width lateral displacement of the fibular tip. 2. A transverse fracture through the medial malleolus is also noted with widening of the medial clear space of the ankle joint up to 13 mm from lateral subluxation of the talar dome relative to the tibial plafond. 3. No definite posterior malleolar fracture though could be occult due to overlap of osseous elements. 4. Small ankle joint effusion with tiny ossific intra-articular loose bodies noted anteriorly and possibly medially. Electronically Signed   By: Tollie Eth M.D.   On: 04/27/2017 22:17   Chest Portable 1 View  Result Date: 04/28/2017 CLINICAL DATA:  Preoperative radiograph EXAM: PORTABLE CHEST 1 VIEW COMPARISON:  None. FINDINGS: The heart size and mediastinal contours are within normal limits. Both lungs are clear. The visualized skeletal structures are unremarkable. IMPRESSION: No active disease. Electronically Signed  By: Deatra Robinson M.D.   On: 04/28/2017 06:13    Micro Results     Recent Results (from the past 240 hour(s))  MRSA PCR Screening     Status: None   Collection Time: 04/28/17  5:20 AM  Result Value Ref Range Status   MRSA by PCR NEGATIVE NEGATIVE Final    Comment:        The GeneXpert MRSA Assay (FDA approved for NASAL specimens only), is one component of a comprehensive MRSA colonization surveillance program.  It is not intended to diagnose MRSA infection nor to guide or monitor treatment for MRSA infections.        Today   Subjective:   Brandi Chang today , Awake, says that right ankle pain is well controlled.  Objective:   Blood pressure (!) 149/64, pulse 99, temperature 99 F (37.2 C), temperature source Oral, resp. rate 15, height 5\' 2"  (1.575 m), weight 57.1 kg (125 lb 14.4 oz), SpO2 95 %.   Intake/Output Summary (Last 24 hours) at 04/30/17 0800 Last data filed at 04/30/17 0600  Gross per 24 hour  Intake              240 ml  Output             1500 ml  Net            -1260 ml    Exam Awake Alert, awake. Pleasantly confused Symmetrical Chest wall movement, Good air movement bilaterally, CTAB S1, S2 +ve B.Sounds, Abd Soft, Non tender, No organomegaly appriciated, No rebound -guarding or rigidity. No Cyanosis, Clubbing or edema She has dressing to the right ankle.  Data Review   CBC w Diff:  Lab Results  Component Value Date   WBC 8.0 05/01/2017   HGB 12.1 05/01/2017   HCT 35.6 05/01/2017   PLT 236 05/01/2017   LYMPHOPCT 20 04/28/2017   MONOPCT 11 04/28/2017   EOSPCT 0 04/28/2017   BASOPCT 1 04/28/2017    CMP:  Lab Results  Component Value Date   NA 137 04/30/2017   K 3.5 04/30/2017   CL 100 (L) 04/30/2017   CO2 27 04/30/2017   BUN 18 04/30/2017   CREATININE 0.60 04/30/2017   PROT 6.7 04/28/2017   ALBUMIN 3.8 04/28/2017   BILITOT 1.0 04/28/2017   ALKPHOS 73 04/28/2017   AST 25 04/28/2017   ALT 17 04/28/2017  .   Total Time in preparing paper work, data evaluation and todays exam - 35 minutes  Orie Fisherman M.D on 05/01/2017 at 8:42 AM    Note: This dictation was prepared with Dragon dictation along with smaller phrase technology. Any transcriptional errors that result from this process are unintentional.

## 2017-04-30 NOTE — Progress Notes (Addendum)
Tele-sitter was discontinued today. Plan is for patient to D/C to [facility] tomorrow pending medical clearance. [facility] is aware of above. Clinical Social Worker (CSW) will continue to follow and assist as needed.  Patient's sons Brandi JupiterDale and Brandi PrayBarry are aware of above. CSW explained long term care options with son Brandi PrayBarry and gave him information on how to apply for long term care medicaid. CSW also gave son Brandi JupiterDale private duty Statisticiansitter list as his request.     Baker Hughes IncorporatedBailey Clent Damore, LCSw [phone]

## 2017-04-30 NOTE — Evaluation (Signed)
Occupational Therapy Evaluation Patient Details Name: Brandi BloomChristmas M Chang MRN: [id number] DOB: 07-01-1930 Today's Date: 04/30/2017    History of Present Illness Pt is a 83 y/o F s/p fall with resultant bimalleolar fractures.  Pt is now s/p R ankle ORIF.  Pt's PMH includes dementia, pt is a TEFL teacherJehovah's witness.    Clinical Impression   Pt seen for OT evaluation this date. Difficult to determine pt's PLOF, however chart review indicates pt's sons were checking in on her often. Pt reportedly living alone in 2 story home (laundry in basement) with 3 cats and 2 steps to get inside (no rails). Pt does not recall any past falls, however recent fall led to current admission due to ankle fx. No family present for session to confirm information provided by pt. Pt was pleasantly confused, able to follow simple commands and reporting no pain during session. Pt presents with impaired safety awareness/activity tolerance/balance/strength/ROM using RLE with NWB'ing status. Pt will benefit from skilled OT services to support recall and carryover of NWB'ing precautions and to address noted impairments and functional deficits. Recommend STR following hospitalization. Family may need to consider longer term plans for pt returning to the home alone given dementia diagnosis and safety.    Follow Up Recommendations  SNF    Equipment Recommendations  3 in 1 bedside commode    Recommendations for Other Services       Precautions / Restrictions Precautions Precautions: Fall Restrictions Weight Bearing Restrictions: Yes RLE Weight Bearing: Non weight bearing      Mobility Bed Mobility     General bed mobility comments: deferred, pt up in recliner for session  Transfers       General transfer comment: deferred due to pt fatigue/safety    Balance Overall balance assessment: Needs assistance;History of Falls Sitting-balance support: Bilateral upper extremity supported;Feet supported Sitting balance-Leahy  Scale: Fair                                 ADL either performed or assessed with clinical judgement   ADL Overall ADL's : Needs assistance/impaired Eating/Feeding: Sitting;Set up   Grooming: Sitting;Set up   Upper Body Bathing: Sitting;Supervision/ safety;Set up   Lower Body Bathing: Minimal assistance;Sitting/lateral leans;Moderate assistance;Cueing for sequencing   Upper Body Dressing : Supervision/safety;Set up;Sitting   Lower Body Dressing: Minimal assistance;Sitting/lateral leans;Moderate assistance;Cueing for sequencing                 General ADL Comments: pt generally min-mod assist for LB ADL     Vision Baseline Vision/History: Wears glasses Wears Glasses: Reading only Patient Visual Report: No change from baseline Vision Assessment?: No apparent visual deficits     Perception     Praxis      Pertinent Vitals/Pain Pain Assessment: No/denies pain Faces Pain Scale: Hurts even more Pain Location: pt does report her low back gives her trouble sometimes, but not presently Pain Descriptors / Indicators: Moaning;Grimacing Pain Intervention(s): Limited activity within patient's tolerance;Monitored during session     Hand Dominance     Extremity/Trunk Assessment Upper Extremity Assessment Upper Extremity Assessment: Overall WFL for tasks assessed   Lower Extremity Assessment Lower Extremity Assessment: Defer to PT evaluation;RLE deficits/detail RLE: Unable to fully assess due to immobilization   Cervical / Trunk Assessment Cervical / Trunk Assessment: Kyphotic   Communication Communication Communication: No difficulties (pt with dementia at baseline)   Cognition Arousal/Alertness: Awake/alert Behavior During Therapy: [facility] for tasks  assessed/performed Overall Cognitive Status: History of cognitive impairments - at baseline                                 General Comments: pt with dementia at baseline, oriented to self, very  slight insight into situation, place, time/day. When OT said good morning, pt stated, "Oh, are you sure it's morning, I thought it was the evening."   General Comments       Exercises Other Exercises Other Exercises: pt educated in non weight bearing status for RLE. Pt would benefit from additional instruction/reminders to support recall   Shoulder Instructions      Home Living Family/patient expects to be discharged to:: Private residence Living Arrangements: Alone Available Help at Discharge: Family;Available PRN/intermittently (per chart review, sons check on her often) Type of Home: House Home Access: Stairs to enter [employer] of Steps: "just a couple"   Home Layout: Two level;Laundry or work area in basement;Able to live on main level with bedroom/bathroom     Bathroom Shower/Tub: Tub/shower unit;Walk-in shower         Home Equipment: None   Additional Comments: no family present to confirm information provided by pt, some information gathered via chart review      Prior Functioning/Environment Level of Independence: Needs assistance        Comments: No family present to provide information regarding PLOF or home layout. Per pt report and chart review, pt able to drive, perform self care tasks indep. Question the accuracy of this information. Pt unable to recall she recently fell leading to her ankle fx.        OT Problem List: Decreased strength;Impaired balance (sitting and/or standing);Decreased activity tolerance;Decreased safety awareness;Decreased range of motion      OT Treatment/Interventions: Self-care/ADL training;Therapeutic exercise;Therapeutic activities;Energy conservation;DME and/or AE instruction;Patient/family education    OT Goals(Current goals can be found in the care plan section) Acute Rehab OT Goals OT Goal Formulation: Patient unable to participate in goal setting  OT Frequency: Min 1X/week   Barriers to D/C: Inaccessible home  environment;Decreased caregiver support          Co-evaluation              AM-PAC PT "6 Clicks" Daily Activity     Outcome Measure Help from another person eating meals?: None Help from another person taking care of personal grooming?: None Help from another person toileting, which includes using toliet, bedpan, or urinal?: A Lot Help from another person bathing (including washing, rinsing, drying)?: A Lot Help from another person to put on and taking off regular upper body clothing?: A Little Help from another person to put on and taking off regular lower body clothing?: A Lot 6 Click Score: 17   End of Session    Activity Tolerance: Patient tolerated treatment well (limited slightly by fatigue following PT session) Patient left: in chair;with call bell/phone within reach;with chair alarm set  OT Visit Diagnosis: Other abnormalities of gait and mobility (R26.89);History of falling (Z91.81)                Time: 1610-9604 OT Time Calculation (min): 18 min Charges:  OT General Charges $OT Visit: 1 Procedure OT Evaluation $OT Eval Low Complexity: 1 Procedure G-Codes:     Richrd Prime, MPH, MS, OTR/L ascom [phone] 04/30/17, 11:38 AM

## 2017-04-30 NOTE — Progress Notes (Signed)
Physical Therapy Treatment Patient Details Name: Brandi Chang MRN: [id number] DOB: Feb 11, 1930 Today's Date: 04/30/2017    History of Present Illness Pt is a 83 y/o F s/p fall with resultant bimalleolar fractures.  Pt is now s/p R ankle ORIF.  Pt's PMH includes dementia, pt is a TEFL teacher witness.     PT Comments    Participated in exercises as described below.  Pt to edge of bed with mod a x 1.  She was able to stand with walker and mod a x 2 maintaining WB status with assist.  She was unable to hop/pivot with walker but was able to stand pivot to chair after seated rest.  She was unable to recall WB status and was re-educated "I know they have told me".    Reviewed call bell system and call bell on lap.  Nurse tech assisted with transfer.     Follow Up Recommendations  SNF     Equipment Recommendations       Recommendations for Other Services       Precautions / Restrictions Precautions Precautions: Fall Restrictions Weight Bearing Restrictions: Yes RLE Weight Bearing: Non weight bearing    Mobility  Bed Mobility Overal bed mobility: Needs Assistance Bed Mobility: Supine to Sit     Supine to sit: HOB elevated;Mod assist     General bed mobility comments: assist to sit fully upright at edge of bed.  Transfers Overall transfer level: Needs assistance Equipment used: Rolling walker (2 wheeled) Transfers: Sit to/from [employer] Sit to Stand: Min assist;Mod assist;+2 physical assistance Stand pivot transfers: Mod assist;+2 physical assistance       General transfer comment: Stood with walker with assist to keep NWB RLE by Clinical research associate.  She was uanble to hop.  Returned to sitting and stand pivot with +2 assist to recliner.  Was able to maintain WB with assist today.  Ambulation/Gait             General Gait Details: unable   Stairs            Wheelchair Mobility    Modified Rankin (Stroke Patients Only)       Balance Overall  balance assessment: Needs assistance;History of Falls Sitting-balance support: Bilateral upper extremity supported;Feet supported Sitting balance-Leahy Scale: Fair     Standing balance support: Bilateral upper extremity supported Standing balance-Leahy Scale: Poor                              Cognition Arousal/Alertness: Awake/alert Behavior During Therapy: WFL for tasks assessed/performed Overall Cognitive Status: History of cognitive impairments - at baseline                                        Exercises Other Exercises Other Exercises: Supine AAROM RLE, AROM LLE for heel slides, SLR, ab/adduction, SAQ and ankle pumps on LLE    General Comments        Pertinent Vitals/Pain Faces Pain Scale: Hurts even more Pain Location: Moaning when RLE in dependent position Pain Descriptors / Indicators: Moaning;Grimacing Pain Intervention(s): Limited activity within patient's tolerance    Home Living                      Prior Function            PT Goals (current goals can  now be found in the care plan section) Progress towards PT goals: Progressing toward goals    Frequency    BID      PT Plan Current plan remains appropriate    Co-evaluation              AM-PAC PT "6 Clicks" Daily Activity  Outcome Measure  Difficulty turning over in bed (including adjusting bedclothes, sheets and blankets)?: Total Difficulty moving from lying on back to sitting on the side of the bed? : Total Difficulty sitting down on and standing up from a chair with arms (e.g., wheelchair, bedside commode, etc,.)?: Total Help needed moving to and from a bed to chair (including a wheelchair)?: Total Help needed walking in hospital room?: Total Help needed climbing 3-5 steps with a railing? : Total 6 Click Score: 6    End of Session Equipment Utilized During Treatment: Gait belt Activity Tolerance: Patient tolerated treatment well Patient left: in  chair;with chair alarm set;with call bell/phone within reach   Pain - Right/Left: Right Pain - part of body: Ankle and joints of foot     Time: [id number] PT Time Calculation (min) (ACUTE ONLY): 18 min  Charges:  $Therapeutic Exercise: 8-22 mins                    G Codes:       Danielle DessSarah Kinzey Sheriff, PTA 04/30/17, 10:18 AM

## 2017-04-30 NOTE — Progress Notes (Signed)
Physical Therapy Treatment Patient Details Name: Brandi Chang MRN: [id number] DOB: June 22, 1930 Today's Date: 04/30/2017    History of Present Illness Pt is a 83 y/o F s/p fall with resultant bimalleolar fractures.  Pt is now s/p R ankle ORIF.  Pt's PMH includes dementia, pt is a TEFL teacherJehovah's witness.     PT Comments    Pt on commode with CNA upon arrival.  Assisted with stand pivot transfer to bed with mod a x 1.  Pt more aware and does well following NWB precautions.  She remains unable to hop with walker.  Participated in exercises as described below.  Overall progressing with mobility but remains non-ambulatory.     Follow Up Recommendations  SNF     Equipment Recommendations       Recommendations for Other Services       Precautions / Restrictions Precautions Precautions: Fall Restrictions Weight Bearing Restrictions: Yes RLE Weight Bearing: Non weight bearing    Mobility  Bed Mobility Overal bed mobility: Needs Assistance Bed Mobility: Sit to Supine       Sit to supine: Min assist;HOB elevated   General bed mobility comments: verbal cues to maintain NWB  Transfers Overall transfer level: Needs assistance Equipment used: None Transfers: Stand Pivot Transfers Sit to Stand: Mod assist Stand pivot transfers: Mod assist       General transfer comment: Did well with NWB during stand pivot from commode  Ambulation/Gait             General Gait Details: unable   Stairs            Wheelchair Mobility    Modified Rankin (Stroke Patients Only)       Balance Overall balance assessment: Needs assistance;History of Falls Sitting-balance support: Bilateral upper extremity supported;Feet supported Sitting balance-Leahy Scale: Fair     Standing balance support: Bilateral upper extremity supported Standing balance-Leahy Scale: Poor                              Cognition Arousal/Alertness: Awake/alert Behavior During Therapy: WFL  for tasks assessed/performed Overall Cognitive Status: Within Functional Limits for tasks assessed                                 General Comments: pt with dementia at baseline, oriented to self, very slight insight into situation, place, time/day. When OT said good morning, pt stated, "Oh, are you sure it's morning, I thought it was the evening."      Exercises Other Exercises Other Exercises: BLE SLR, heel slides, AB/Adduction x 10, ankle pumps LLE  Other Exercises: commode, - able to provide independant self care in sitting    General Comments        Pertinent Vitals/Pain Pain Assessment: 0-10 Pain Location: R LE Pain Descriptors / Indicators: Aching Pain Intervention(s): Limited activity within patient's tolerance;Monitored during session    Home Living Family/patient expects to be discharged to:: Private residence Living Arrangements: Alone Available Help at Discharge: Family;Available PRN/intermittently (per chart review, sons check on her often) Type of Home: House Home Access: Stairs to enter   Home Layout: Two level;Laundry or work area in basement;Able to live on main level with Pilgrim's Pridebedroom/bathroom Home Equipment: None Additional Comments: no family present to confirm information provided by pt, some information gathered via chart review    Prior Function Level of Independence: Needs assistance  Comments: No family present to provide information regarding PLOF or home layout. Per pt report and chart review, pt able to drive, perform self care tasks indep. Question the accuracy of this information. Pt unable to recall she recently fell leading to her ankle fx.   PT Goals (current goals can now be found in the care plan section) Progress towards PT goals: Progressing toward goals    Frequency    BID      PT Plan Current plan remains appropriate    Co-evaluation              AM-PAC PT "6 Clicks" Daily Activity  Outcome Measure   Difficulty turning over in bed (including adjusting bedclothes, sheets and blankets)?: Total Difficulty moving from lying on back to sitting on the side of the bed? : Total Difficulty sitting down on and standing up from a chair with arms (e.g., wheelchair, bedside commode, etc,.)?: Total Help needed moving to and from a bed to chair (including a wheelchair)?: A Lot Help needed walking in hospital room?: Total Help needed climbing 3-5 steps with a railing? : Total 6 Click Score: 7    End of Session Equipment Utilized During Treatment: Gait belt Activity Tolerance: Patient tolerated treatment well Patient left: in bed;with bed alarm set;with call bell/phone within reach   Pain - Right/Left: Right Pain - part of body: Ankle and joints of foot     Time: 1350-1400 PT Time Calculation (min) (ACUTE ONLY): 10 min  Charges:  $Therapeutic Exercise: 8-22 mins                    G Codes:       Danielle Dess, PTA 04/30/17, 2:46 PM

## 2017-04-30 NOTE — Progress Notes (Signed)
*  PRELIMINARY RESULTS* Echocardiogram 2D Echocardiogram has been performed.  Brandi Chang 04/30/2017, 9:42 AM

## 2017-04-30 NOTE — Progress Notes (Signed)
Subjective:  Postoperative day #2 status post ORIF of right ankle. Patient reports pain as mild.  Patient is confused but pleasant today.  Objective:   VITALS:   Vitals:   04/28/17 2228 04/29/17 1612 04/29/17 2357 04/30/17 0400  BP: 138/65 (!) 142/67 (!) 149/64   Pulse: 75 92 99   Resp: 18  15   Temp: 99 F (37.2 C) 99.2 F (37.3 C) 99 F (37.2 C)   TempSrc:  Oral Oral   SpO2: 98% 99% 97% 95%  Weight:      Height:        PHYSICAL EXAM: Right lower extremity: Patient's splint and dressing are clean dry and intact. She continues to elevate the right lower extremity. She can flex and extend her toes and has intact sensation light touch. Her toes well-perfused. Neurovascular intact Sensation intact distally   LABS  Results for orders placed or performed during the hospital encounter of 04/27/17 (from the past 24 hour(s))  Urinalysis, Routine w reflex microscopic     Status: Abnormal   Collection Time: 04/29/17  4:00 PM  Result Value Ref Range   Color, Urine STRAW (A) YELLOW   APPearance CLEAR (A) CLEAR   Specific Gravity, Urine 1.006 1.005 - 1.030   pH 7.0 5.0 - 8.0   Glucose, UA NEGATIVE NEGATIVE mg/dL   Hgb urine dipstick MODERATE (A) NEGATIVE   Bilirubin Urine NEGATIVE NEGATIVE   Ketones, ur 5 (A) NEGATIVE mg/dL   Protein, ur NEGATIVE NEGATIVE mg/dL   Nitrite NEGATIVE NEGATIVE   Leukocytes, UA NEGATIVE NEGATIVE   RBC / HPF TOO NUMEROUS TO COUNT 0 - 5 RBC/hpf   WBC, UA 0-5 0 - 5 WBC/hpf   Bacteria, UA NONE SEEN NONE SEEN   Squamous Epithelial / LPF 0-5 (A) NONE SEEN   Mucous PRESENT   CBC     Status: Abnormal   Collection Time: 04/30/17  5:15 AM  Result Value Ref Range   WBC 9.7 3.6 - 11.0 K/uL   RBC 3.66 (L) 3.80 - 5.20 MIL/uL   Hemoglobin 12.1 12.0 - 16.0 g/dL   HCT 16.135.3 09.635.0 - 04.547.0 %   MCV 96.3 80.0 - 100.0 fL   MCH 33.1 26.0 - 34.0 pg   MCHC 34.3 32.0 - 36.0 g/dL   RDW 40.913.7 81.111.5 - 91.414.5 %   Platelets 221 150 - 440 K/uL  Basic metabolic panel      Status: Abnormal   Collection Time: 04/30/17  5:15 AM  Result Value Ref Range   Sodium 137 135 - 145 mmol/L   Potassium 3.5 3.5 - 5.1 mmol/L   Chloride 100 (L) 101 - 111 mmol/L   CO2 27 22 - 32 mmol/L   Glucose, Bld 106 (H) 65 - 99 mg/dL   BUN 18 6 - 20 mg/dL   Creatinine, Ser 7.820.60 0.44 - 1.00 mg/dL   Calcium 8.5 (L) 8.9 - 10.3 mg/dL   GFR calc non Af Amer >60 >60 mL/min   GFR calc Af Amer >60 >60 mL/min   Anion gap 10 5 - 15    Dg Ankle Complete Right  Result Date: 04/28/2017 CLINICAL DATA:  Bimalleolar RIGHT ankle fracture post ORIF EXAM: RIGHT ANKLE - COMPLETE 3+ VIEW COMPARISON:  04/27/2017 FINDINGS: Bone detail limited by fiberglass cast material. Three cannulated screws present across a reduced fracture of the medial malleolus. Lateral plate and multiple screws present across a reduced fracture of the lateral malleolus. Ankle joint space now normally aligned. No additional fracture,  dislocation, or bone destruction identified. IMPRESSION: Post bimalleolar ORIF. Electronically Signed   By: Ulyses Southward M.D.   On: 04/28/2017 12:44    Assessment/Plan: 2 Days Post-Op   Active Problems:   Bimalleolar ankle fracture   Patient is doing well from orthopedic standpoint. Continue physical therapy as appropriate. Continue Lovenox for DVT prophylaxis 2 weeks following discharge. The patient's follow up my office in 10-14 days for wound check, staple removal, x-ray and cast application. Patient is to be nonweightbearing on the right lower extremity for 6 weeks postop.   Juanell Fairly , MD 04/30/2017, 11:32 AM

## 2017-04-30 NOTE — Progress Notes (Signed)
[facility] [place] [facility]   PATIENT NAME: Brandi Chang    MR#:  [id number]  DATE OF BIRTH:  12/05/1929  SUBJECTIVE: Admitted for right ankle fracture status post repair. Patient is waiting for 81 night stay for discharge.   CHIEF COMPLAINT:   Chief Complaint  Patient presents with  . Ankle Pain    REVIEW OF SYSTEMS:    Review of Systems  Unable to perform ROS: Dementia    Nutrition:  Tolerating Diet: Tolerating PT:      DRUG ALLERGIES:  No Known Allergies  VITALS:  Blood pressure (!) 149/64, pulse 99, temperature 99 F (37.2 C), temperature source Oral, resp. rate 15, height 5\' 2"  (1.575 m), weight 57.1 kg (125 lb 14.4 oz), SpO2 95 %.  PHYSICAL EXAMINATION:   Physical Exam  GENERAL:  83 y.o.-year-old patient lying in the bed ,sleepy, EYES: Pupils equal, round, reactive to light.  HEENT: Head atraumatic, normocephalic. Oropharynx and nasopharynx clear.  NECK:  Supple, no jugular venous distention. No thyroid enlargement, no tenderness.  LUNGS: Normal breath sounds bilaterally, no wheezing, rales,rhonchi or crepitation. No use of accessory muscles of respiration.  CARDIOVASCULAR: S1, S2 normal. No murmurs, rubs, or gallops.  ABDOMEN: Soft, nontender, nondistended. Bowel sounds present. No organomegaly or mass.  EXTREMITIES: No pedal edema, cyanosis, or clubbing.  NEUROLOGIC: Patient sedated. Received Ativan because of her agitation.  PSYCHIATRIC: The patient is alert and oriented x 3.  SKIN: No obvious rash, lesion, or ulcer.    LABORATORY PANEL:   CBC  Recent Labs Lab 04/30/17 0515  WBC 9.7  HGB 12.1  HCT 35.3  PLT 221   ------------------------------------------------------------------------------------------------------------------  Chemistries   Recent Labs Lab 04/28/17 0553  04/30/17 0515  NA 137  < > 137  K 4.2  < > 3.5  CL 104  < > 100*  CO2 28  < > 27  GLUCOSE 108*  < > 106*  BUN 21*  < > 18   CREATININE 0.47  < > 0.60  CALCIUM 8.5*  < > 8.5*  AST 25  --   --   ALT 17  --   --   ALKPHOS 73  --   --   BILITOT 1.0  --   --   < > = values in this interval not displayed. ------------------------------------------------------------------------------------------------------------------  Cardiac Enzymes No results for input(s): TROPONINI in the last 168 hours. ------------------------------------------------------------------------------------------------------------------  RADIOLOGY:  Dg Ankle Complete Right  Result Date: 04/28/2017 CLINICAL DATA:  Bimalleolar RIGHT ankle fracture post ORIF EXAM: RIGHT ANKLE - COMPLETE 3+ VIEW COMPARISON:  04/27/2017 FINDINGS: Bone detail limited by fiberglass cast material. Three cannulated screws present across a reduced fracture of the medial malleolus. Lateral plate and multiple screws present across a reduced fracture of the lateral malleolus. Ankle joint space now normally aligned. No additional fracture, dislocation, or bone destruction identified. IMPRESSION: Post bimalleolar ORIF. Electronically Signed   By: Ulyses Southward M.D.   On: 04/28/2017 12:44     ASSESSMENT AND PLAN:   Active Problems:   Bimalleolar ankle fracture    Right Bimalleolar ankle fracture'status post repair. Continue physical therapy, nonweightbearing to right leg for 6 weeks as per ortho., continue DVT prophylaxis. Postoperative course complicated by her dementia getting worse requiring Ativan and tele sitterr also,. Today she is better, discontinue tele sitter.. Her, continue Ativan, patient needs 3 nights.for Her to go to rehabilitation. Most likely discharge tomorrow. All the records are reviewed and case discussed  with Care Management/Social Workerr. Management plans discussed with the patient, family and they are in agreement.  CODE STATUS:full  TOTAL TIME TAKING CARE OF THIS PATIENT: 35 minutes.   POSSIBLE D/C IN 1-2DAYS, DEPENDING ON CLINICAL CONDITION. D/w  son  Katha HammingKONIDENA,Evea Sheek M.D on 04/30/2017 at 9:03 AM  Between 7am to 6pm - Pager - [phone]  After 6pm go to [email or web address] - password [email or web address]  [facility]  Office  [phone]  CC: Primary care physician; Patient, No Pcp Per

## 2017-04-30 NOTE — Progress Notes (Signed)
Tele sitter order discontinued and removed from patient's room.   Harvie HeckMelanie Jacey Pelc, RN

## 2017-05-01 ENCOUNTER — Encounter: Payer: Self-pay | Admitting: Internal Medicine

## 2017-05-01 DIAGNOSIS — R52 Pain, unspecified: Secondary | ICD-10-CM | POA: Diagnosis not present

## 2017-05-01 DIAGNOSIS — F0391 Unspecified dementia with behavioral disturbance: Secondary | ICD-10-CM | POA: Diagnosis not present

## 2017-05-01 DIAGNOSIS — W06XXXA Fall from bed, initial encounter: Secondary | ICD-10-CM | POA: Diagnosis not present

## 2017-05-01 DIAGNOSIS — R451 Restlessness and agitation: Secondary | ICD-10-CM | POA: Diagnosis not present

## 2017-05-01 DIAGNOSIS — M25571 Pain in right ankle and joints of right foot: Secondary | ICD-10-CM | POA: Diagnosis not present

## 2017-05-01 DIAGNOSIS — M8000XD Age-related osteoporosis with current pathological fracture, unspecified site, subsequent encounter for fracture with routine healing: Secondary | ICD-10-CM | POA: Diagnosis not present

## 2017-05-01 DIAGNOSIS — K5900 Constipation, unspecified: Secondary | ICD-10-CM | POA: Diagnosis not present

## 2017-05-01 DIAGNOSIS — Z7401 Bed confinement status: Secondary | ICD-10-CM | POA: Diagnosis not present

## 2017-05-01 DIAGNOSIS — S82843A Displaced bimalleolar fracture of unspecified lower leg, initial encounter for closed fracture: Secondary | ICD-10-CM | POA: Diagnosis not present

## 2017-05-01 DIAGNOSIS — S82841D Displaced bimalleolar fracture of right lower leg, subsequent encounter for closed fracture with routine healing: Secondary | ICD-10-CM | POA: Diagnosis not present

## 2017-05-01 DIAGNOSIS — S9304XD Dislocation of right ankle joint, subsequent encounter: Secondary | ICD-10-CM | POA: Diagnosis not present

## 2017-05-01 DIAGNOSIS — M1990 Unspecified osteoarthritis, unspecified site: Secondary | ICD-10-CM | POA: Diagnosis not present

## 2017-05-01 DIAGNOSIS — G4700 Insomnia, unspecified: Secondary | ICD-10-CM | POA: Diagnosis not present

## 2017-05-01 DIAGNOSIS — G9340 Encephalopathy, unspecified: Secondary | ICD-10-CM | POA: Diagnosis not present

## 2017-05-01 DIAGNOSIS — W19XXXD Unspecified fall, subsequent encounter: Secondary | ICD-10-CM | POA: Diagnosis not present

## 2017-05-01 DIAGNOSIS — F0390 Unspecified dementia without behavioral disturbance: Secondary | ICD-10-CM | POA: Diagnosis not present

## 2017-05-01 LAB — CBC
HCT: 35.6 % (ref 35.0–47.0)
HEMOGLOBIN: 12.1 g/dL (ref 12.0–16.0)
MCH: 32.7 pg (ref 26.0–34.0)
MCHC: 34.1 g/dL (ref 32.0–36.0)
MCV: 96.1 fL (ref 80.0–100.0)
Platelets: 236 10*3/uL (ref 150–440)
RBC: 3.71 MIL/uL — AB (ref 3.80–5.20)
RDW: 13.2 % (ref 11.5–14.5)
WBC: 8 10*3/uL (ref 3.6–11.0)

## 2017-05-01 MED ORDER — ENOXAPARIN SODIUM 40 MG/0.4ML ~~LOC~~ SOLN: 40.0000 mg | SUBCUTANEOUS | 0 refills | Status: DC

## 2017-05-01 NOTE — Progress Notes (Signed)
Physical Therapy Treatment Patient Details Name: Brandi Chang MRN: [id number] DOB: Feb 08, 1930 Today's Date: 05/01/2017    History of Present Illness Pt is a 83 y/o F s/p fall with resultant bimalleolar fractures.  Pt is now s/p R ankle ORIF.  Pt's PMH includes dementia, pt is a TEFL teacherJehovah's witness.     PT Comments    Pt pleasantly confused, but highly impulsive and with difficulty following commands. Pt performs bed mobility with modA, tranfers with MaxA +2, and is not able to perform amb due to NWB precautions. Pt presents with the following deficits: strength, balance, sequencing, and safety awareness. Pt responded well to supine therex, but required increased time for motor planning/initialtion verbal/visual/tactile cues to correctly perform exercises. Overall, pt responded well to today's treatment with no adverse affects, pt is progressing towards functional mobility goals. Pt would benefit from skilled PT to address the previously mentioned impairments and promote return to PLOF. Currently recommending SNF, pending d/c.     Follow Up Recommendations  SNF     Equipment Recommendations  Other (comment)    Recommendations for Other Services       Precautions / Restrictions Precautions Precautions: Fall Restrictions Weight Bearing Restrictions: Yes RLE Weight Bearing: Non weight bearing    Mobility  Bed Mobility Overal bed mobility: Needs Assistance Bed Mobility: Sit to Supine       Sit to supine: Mod assist;HOB elevated   General bed mobility comments: ModA to go to supine due to inconsistencies maintaining precautions and following commands. Pt with mod verbal cues to acheive task.   Transfers Overall transfer level: Needs assistance Equipment used: Rolling walker (2 wheeled) Transfers: Stand Pivot Transfers Sit to Stand: Mod assist Stand pivot transfers: Max assist;+2 physical assistance       General transfer comment: Pt MaxA +2 for stand pivot transfer to  and from bedside commode, as she was highly insistent and impulsive to get to commode. Pt able to recall WB precautions, though only able to follow 70% of the time with stand pivot transfer. Continual verbal/visual/tactile cues provided to ensure maintaining precautions. Pt with difficulty following commands.   Ambulation/Gait             General Gait Details: unable, due to WB precautions.    Stairs            Wheelchair Mobility    Modified Rankin (Stroke Patients Only)       Balance                                            Cognition Arousal/Alertness: Awake/alert Behavior During Therapy: WFL for tasks assessed/performed Overall Cognitive Status: Within Functional Limits for tasks assessed                                        Exercises Other Exercises Other Exercises: Supine therex performed to B LE's with supervision x 10 reps: ankle pumps, quad sets, glute squeezes, hip abd, SLR (required minA). APt required increased time and mod verbal/tactile/visual cues due to impaired ability following commands.  Other Exercises: bed side commode: required second person for clothing management. Max cues on maintianing WB precautions.    General Comments        Pertinent Vitals/Pain Pain Assessment: No/denies pain Pain Intervention(s): Limited  activity within patient's tolerance;Monitored during session    Home Living                      Prior Function            PT Goals (current goals can now be found in the care plan section) Acute Rehab PT Goals Patient Stated Goal: pt unable to state PT Goal Formulation: Patient unable to participate in goal setting Time For Goal Achievement: 05/13/17 Potential to Achieve Goals: Fair Progress towards PT goals: Progressing toward goals    Frequency    BID      PT Plan Current plan remains appropriate    Co-evaluation              AM-PAC PT "6 Clicks" Daily  Activity  Outcome Measure  Difficulty turning over in bed (including adjusting bedclothes, sheets and blankets)?: Total Difficulty moving from lying on back to sitting on the side of the bed? : Total Difficulty sitting down on and standing up from a chair with arms (e.g., wheelchair, bedside commode, etc,.)?: Total Help needed moving to and from a bed to chair (including a wheelchair)?: A Lot Help needed walking in hospital room?: Total Help needed climbing 3-5 steps with a railing? : Total 6 Click Score: 7    End of Session Equipment Utilized During Treatment: Gait belt Activity Tolerance: Patient tolerated treatment well Patient left: in bed;with bed alarm set;with call bell/phone within reach Nurse Communication: Mobility status PT Visit Diagnosis: Pain;Unsteadiness on feet (R26.81);History of falling (Z91.81);Muscle weakness (generalized) (M62.81) Pain - Right/Left: Right Pain - part of body: Ankle and joints of foot     Time: 1003-1026 PT Time Calculation (min) (ACUTE ONLY): 23 min  Charges:                       G Codes:       Sharman CheekLaura Charnice Zwilling PT, SPT   Latanya MaudlinLaura M Suheily Birks 05/01/2017, 12:57 PM

## 2017-05-01 NOTE — Progress Notes (Signed)
Patient is medically stable for D/[facility] today and has been 24 hours without a tele-sitter. [facility] can come today to room 502. RN will call report and arrange EMS for transport. Clinical Child psychotherapistocial Worker (CSW) sent D/C orders to [facility] via [facility]. CSW contacted patient's sons Amada JupiterDale and Gery PrayBarry and made them aware of above. Please reconsult if future social work needs arise. CSW signing off.   Baker Hughes IncorporatedBailey Anushree Dorsi, LCSW [phone]

## 2017-05-01 NOTE — Progress Notes (Signed)
Pt discharged via non-emergent [facility] to [name]. Report called to accepting RN. IV removed, pt on room air and in no distress. Mj Willis S Fenton, RN 

## 2017-05-01 NOTE — Care Management Important Message (Signed)
Important Message  Patient Details  Name: Nicholaus BloomChristmas M Hodgman MRN: [id number] Date of Birth: [birth year]   Medicare Important Message Given:  Yes    Marily MemosLisa M Samuel Rittenhouse, RN 05/01/2017, 9:48 AM

## 2017-05-01 NOTE — Clinical Social Work Placement (Signed)
   CLINICAL SOCIAL WORK PLACEMENT  NOTE  Date:  05/01/2017  Patient Details  Name: Brandi Chang MRN: [id number] Date of Birth: 1930/03/30  Clinical Social Work is seeking post-discharge placement for this patient at the Skilled  Nursing Facility level of care (*CSW will initial, date and re-position this form in  chart as items are completed):  Yes   Patient/family provided with [facility] Clinical Social Work Department's list of facilities offering this level of care within the geographic area requested by the patient (or if unable, by the patient's family).  Yes   Patient/family informed of their freedom to choose among providers that offer the needed level of care, that participate in Medicare, Medicaid or managed care program needed by the patient, have an available bed and are willing to accept the patient.  Yes   Patient/family informed of [facility]'s ownership interest in [facility] and [facility], as well as of the fact that they are under no obligation to receive care at these facilities.  PASRR submitted to EDS on 04/29/17     PASRR number received on 04/29/17     Existing PASRR number confirmed on       FL2 transmitted to all facilities in geographic area requested by pt/family on 04/29/17     FL2 transmitted to all facilities within larger geographic area on       Patient informed that his/her managed care company has contracts with or will negotiate with certain facilities, including the following:        Yes   Patient/family informed of bed offers received.  Patient chooses bed at  [name]     Physician recommends and patient chooses bed at      Patient to be transferred to  [name] ) on 05/01/17.  Patient to be transferred to facility by  [facility] )     Patient family notified on 05/01/17 of transfer.  Name of family member notified:   (Patient's sons Brandi Chang and Brandi Chang are aware of D/C today. )     PHYSICIAN     Additional Comment:    _______________________________________________ Littie Chiem, Darleen CrockerBailey M, LCSW 05/01/2017, 11:05 AM

## 2017-05-02 DIAGNOSIS — M8000XD Age-related osteoporosis with current pathological fracture, unspecified site, subsequent encounter for fracture with routine healing: Secondary | ICD-10-CM | POA: Diagnosis not present

## 2017-05-02 DIAGNOSIS — G9340 Encephalopathy, unspecified: Secondary | ICD-10-CM | POA: Insufficient documentation

## 2017-05-02 DIAGNOSIS — F0390 Unspecified dementia without behavioral disturbance: Secondary | ICD-10-CM | POA: Diagnosis not present

## 2017-05-02 DIAGNOSIS — W06XXXA Fall from bed, initial encounter: Secondary | ICD-10-CM | POA: Diagnosis not present

## 2017-05-03 NOTE — Anesthesia Postprocedure Evaluation (Signed)
Anesthesia Post Note  Patient: Brandi Chang  Procedure(s) Performed: Procedure(s) (LRB): OPEN REDUCTION INTERNAL FIXATION (ORIF) ANKLE FRACTURE (Right)  Patient location during evaluation: PACU Anesthesia Type: Spinal Level of consciousness: awake and alert Pain management: pain level controlled Vital Signs Assessment: post-procedure vital signs reviewed and stable Respiratory status: spontaneous breathing, nonlabored ventilation, respiratory function stable and patient connected to nasal cannula oxygen Cardiovascular status: blood pressure returned to baseline and stable Postop Assessment: no signs of nausea or vomiting Anesthetic complications: no     Last Vitals:  Vitals:   04/30/17 0915 05/01/17 1157  BP: (!) 144/63 (!) 130/58  Pulse: 95 100  Resp: 18   Temp: 37.2 C 37 C    Last Pain:  Vitals:   05/01/17 1157  TempSrc: Oral  PainSc:                  Brandi Chang

## 2017-05-09 DIAGNOSIS — S82841D Displaced bimalleolar fracture of right lower leg, subsequent encounter for closed fracture with routine healing: Secondary | ICD-10-CM | POA: Diagnosis not present

## 2017-05-13 DIAGNOSIS — K5900 Constipation, unspecified: Secondary | ICD-10-CM | POA: Diagnosis not present

## 2017-05-13 DIAGNOSIS — G4700 Insomnia, unspecified: Secondary | ICD-10-CM | POA: Diagnosis not present

## 2017-05-13 DIAGNOSIS — R52 Pain, unspecified: Secondary | ICD-10-CM | POA: Diagnosis not present

## 2017-05-13 DIAGNOSIS — R451 Restlessness and agitation: Secondary | ICD-10-CM | POA: Diagnosis not present

## 2017-05-15 ENCOUNTER — Other Ambulatory Visit: Payer: Self-pay | Admitting: *Deleted

## 2017-05-15 NOTE — Patient Outreach (Signed)
[facility]) Care Management  05/15/2017  Lakeyshia SAMREEN SELTZER September 09, 1930 [id number]   Met with SW at facility, she reports patient continues to be NWB. She anticipates that patient will need 24/7 oversight upon discharge home. She reports family is also providing 24/7 caregivers at facility.   Attempted to meet with patient, she was not in her room, was at therapy session.  Plan to follow up on patient discharge plans and any [facility] care management needs.   Royetta Crochet. [name], RN, BSN, [place] [phone]) Business Cell  [phone]) Toll Free Office

## 2017-05-23 ENCOUNTER — Other Ambulatory Visit: Payer: Self-pay | Admitting: *Deleted

## 2017-05-23 DIAGNOSIS — F0390 Unspecified dementia without behavioral disturbance: Secondary | ICD-10-CM

## 2017-05-23 DIAGNOSIS — F03C0 Unspecified dementia, severe, without behavioral disturbance, psychotic disturbance, mood disturbance, and anxiety: Secondary | ICD-10-CM

## 2017-05-23 NOTE — Patient Outreach (Signed)
Call to patient daughter-in-law, Terrill MohrSharon Bertram.  She reports patient going home on 05/25/17. Patient has been living independently even with dementia with some oversight. She now has a broken foot and is NWB for another 4-6 weeks.  Daughter-in-law states they are overwhelmed and would appreciate any care coordination that they can receive and to know about programs and possible placement for future needs of patient.   RNCM [facility] and daughter-in-law would like patient to have access to services.  She will be point of contact as patient has dementia. Instructed that she should expect phone call next week from [facility] as patient not discharging until 05/25/17.  Plan to make referral to [facility] LCSW for community resources such as home care providers and Medicaid.  Plan to make [facility] to [facility] RNCM for transition of care.   Alben SpittleMary E. Albertha GheeNiemczura, RN, BSN, CCM  Post Acute Chartered loss adjusterCare Coordinator [facility] [phone]) Business Cell  [phone]) Toll Free Office

## 2017-05-23 NOTE — Patient Outreach (Signed)
[facility]) Care Management  05/23/2017  Brandi Chang 02-Mar-1930 [id number]  Met with patient and sitter at facility. Sitter, [name] called patient daughter-in-law [name] for [facility] to speak with but it was not a good time to speak, she requested RNCM call back later today. RNCM left a packet with patient and caregiver to give daughter-in-law for review.   Met with [name], business office manager regarding discharge as SW is on vacation. She reports patient to discharge 05/25/17.  The patient will go home with private pay sitters and home care.  Family is considering ALF, patient has dementia and is NWB for 4-6 more weeks.   Plan to call daughter in law regarding [facility] care management services.  Royetta Crochet. [name], RN, BSN, [place] [phone]) Business Cell  [phone]) Toll Free Office

## 2017-05-27 DIAGNOSIS — F0390 Unspecified dementia without behavioral disturbance: Secondary | ICD-10-CM | POA: Diagnosis not present

## 2017-05-27 DIAGNOSIS — M1990 Unspecified osteoarthritis, unspecified site: Secondary | ICD-10-CM | POA: Diagnosis not present

## 2017-05-27 DIAGNOSIS — Z9181 History of falling: Secondary | ICD-10-CM | POA: Diagnosis not present

## 2017-05-27 DIAGNOSIS — S82841D Displaced bimalleolar fracture of right lower leg, subsequent encounter for closed fracture with routine healing: Secondary | ICD-10-CM | POA: Diagnosis not present

## 2017-05-28 ENCOUNTER — Other Ambulatory Visit: Payer: Self-pay | Admitting: *Deleted

## 2017-05-28 NOTE — Patient Outreach (Addendum)
[facility]) Care Management  05/28/2017  Burnis MARIESSA ELZEY Aug 16, 1930 [id number]   8/16 Referral received from [facility], post acute care coordinator for transition of care. 818 Patient discharged from [facility] SNF after rehab following ankle fracture and surgery.  Placed transition of care call to patient daughter in law Delaysia Paup , representative for patient . Patient has dementia.Explained reason for call, and [facility] care management transition of care services, she is agreeable to services.   Daughter in law discussed patient is staying in her own home , with 24 hour sitters that they plan to provide for the next possible 4 to 6 weeks. Patient has current orders for NWB on right ankle . She discussed the difficulties with preventing weight bearing, patient does not recognize she has broken ankle, her home is not handicap assess ible, narrow bathroom door entrance, her current walker will not fit inside door of bathroom, , patient was trained while in facility to hop on good foot, but that does not happen all the time . Daughter discussed she is getting narrow walker on today.  [facility] RN has completed initial visit as well as physical therapy.   Daughter reports patient has orthopedic appointment on Friday 8/24 and to neurologist on 8/22, family to complete building ramp at her  home  on today.   Caregiver unable to complete initial transition of care call at this time and request call on next day.  Plan Will plan follow up call for transition of care in the next day per request.    Egbert Garibaldi, RN, [facility] Care Management,Care Management Coordinator  [phone]- Mobile [phone]- Toll Free Main Office

## 2017-05-29 ENCOUNTER — Encounter: Payer: Self-pay | Admitting: *Deleted

## 2017-05-29 ENCOUNTER — Other Ambulatory Visit: Payer: Self-pay | Admitting: *Deleted

## 2017-05-29 DIAGNOSIS — F0390 Unspecified dementia without behavioral disturbance: Secondary | ICD-10-CM | POA: Insufficient documentation

## 2017-05-29 DIAGNOSIS — F39 Unspecified mood [affective] disorder: Secondary | ICD-10-CM | POA: Insufficient documentation

## 2017-05-29 DIAGNOSIS — R413 Other amnesia: Secondary | ICD-10-CM | POA: Insufficient documentation

## 2017-05-29 NOTE — Patient Outreach (Signed)
[name]  05/29/2017   Brandi Chang 1930-07-29 [id number] Follow up call for transition of care .  Subjective:  Daughter in law discussed visit to neurologist on today and new medications started.  Daughter discussed being very busy coordinating patient home care with sitter and patient appointment appointments.      Encounter Medications:  Outpatient Encounter Prescriptions as of 05/29/2017  Medication Sig Note  . divalproex (DEPAKOTE) 125 MG DR tablet Take 125 mg by mouth 2 (two) times daily.   Marland Kitchen donepezil (ARICEPT) 5 MG tablet Take 5 mg by mouth at bedtime. 05/29/2017: New order 05/29/17  . meloxicam (MOBIC) 15 MG tablet Take 1 tablet by mouth daily.   . methocarbamol (ROBAXIN) 500 MG tablet Take 1 tablet (500 mg total) by mouth every 6 (six) hours as needed for muscle spasms.   . traZODone (DESYREL) 50 MG tablet Take 25 mg by mouth at bedtime. Take 25 mg each evening   . docusate sodium (COLACE) 100 MG capsule Take 1 capsule (100 mg total) by mouth 2 (two) times daily. (Patient not taking: Reported on 05/29/2017)   . enoxaparin (LOVENOX) 40 MG/0.4ML injection Inject 0.4 mLs (40 mg total) into the skin daily. (Patient not taking: Reported on 05/29/2017)   . LORazepam (ATIVAN) 1 MG tablet Take 1 tablet (1 mg total) by mouth every 6 (six) hours as needed for anxiety (agitation). (Patient not taking: Reported on 05/29/2017)    No facility-administered encounter medications on file as of 05/29/2017.   Patient was recently discharged from hospital and all medications have been reviewed.  Functional Status:  In your present state of health, do you have any difficulty performing the following activities: 04/28/2017  Hearing? N  Vision? N  Difficulty concentrating or making decisions? Y  Walking or climbing stairs? N  Dressing or bathing? N  Doing errands, shopping? Y  Some recent data might be hidden    Fall/Depression  Screening: Fall Risk  05/29/2017  Falls in the past year? Yes  Number falls in past yr: 1  Injury with Fall? Yes  Risk Factor Category  High Fall Risk  Risk for fall due to : Impaired balance/gait;History of fall(s);Impaired mobility  Follow up Falls prevention discussed   No flowsheet data found.  Patient has attended neurology visit on today, family to get prescriptions filled and begin medications. Patient has orthopedic appointment on 8/24. Patient now has ramp into home completed.  Discussed PCP visit post discharge from rehab, daughter will plan to schedule visit.    Plan:  Will follow patient for transition of care, weekly outreach plan home  visit scheduled in the next week. Will send PCP barrier note.      [facility] CM Care Plan Problem One     Most Recent Value  Care Plan Problem One  Patient with recent right ankle fracture   Role Documenting the Problem One  Care Management Coordinator  Care Plan for Problem One  Active  [facility] Long Term Goal   Patient will not experience hospital admission in the next 31 days   [facility] Long Term Goal Start Date  05/29/17  Interventions for Problem One Long Term Goal  Advised regarding transition of care weekly outreach ,taking medications as prescribed , adhering to discharge instructions  [facility] CM Short Term Goal #1   Patient will attend all medical appointments in the next 30 days   [facility] CM Short Term Goal #1 Start Date  05/29/17  Interventions for Short Term Goal #1  Advised regarding importance of attending all appointments and scheduling office visit with PCP   [facility] CM Short Term Goal #2   Patient will not experience a fall in the next 30 days   [facility] CM Short Term Goal #2 Start Date  05/29/17  Interventions for Short Term Goal #2  Advised regarding adhering to safety precautions as much as tolerated,using walker as tolerated,  including assisting patient with movement as allowed, and sitters as bedside as planned        Egbert Garibaldi, RN,  [facility] Care Management,Care Management Coordinator  [phone]- Mobile [phone]- Toll Free Main Office

## 2017-05-30 DIAGNOSIS — F0390 Unspecified dementia without behavioral disturbance: Secondary | ICD-10-CM | POA: Diagnosis not present

## 2017-05-30 DIAGNOSIS — Z9181 History of falling: Secondary | ICD-10-CM | POA: Diagnosis not present

## 2017-05-30 DIAGNOSIS — S82841D Displaced bimalleolar fracture of right lower leg, subsequent encounter for closed fracture with routine healing: Secondary | ICD-10-CM | POA: Diagnosis not present

## 2017-05-30 DIAGNOSIS — M1990 Unspecified osteoarthritis, unspecified site: Secondary | ICD-10-CM | POA: Diagnosis not present

## 2017-05-31 ENCOUNTER — Ambulatory Visit: Payer: Self-pay | Admitting: *Deleted

## 2017-05-31 DIAGNOSIS — S82841D Displaced bimalleolar fracture of right lower leg, subsequent encounter for closed fracture with routine healing: Secondary | ICD-10-CM | POA: Diagnosis not present

## 2017-06-01 ENCOUNTER — Other Ambulatory Visit: Payer: Self-pay | Admitting: *Deleted

## 2017-06-04 ENCOUNTER — Other Ambulatory Visit: Payer: Self-pay | Admitting: *Deleted

## 2017-06-04 DIAGNOSIS — S82841D Displaced bimalleolar fracture of right lower leg, subsequent encounter for closed fracture with routine healing: Secondary | ICD-10-CM | POA: Diagnosis not present

## 2017-06-04 DIAGNOSIS — Z9181 History of falling: Secondary | ICD-10-CM | POA: Diagnosis not present

## 2017-06-04 DIAGNOSIS — M1990 Unspecified osteoarthritis, unspecified site: Secondary | ICD-10-CM | POA: Diagnosis not present

## 2017-06-04 DIAGNOSIS — F0390 Unspecified dementia without behavioral disturbance: Secondary | ICD-10-CM | POA: Diagnosis not present

## 2017-06-04 NOTE — Patient Outreach (Signed)
[facility]) Care Management  06/04/2017  Brandi Chang 03-21-1930 [id number]   CSW made 3rd phone outreach attempt on 06/04/17 and left message. CSW received callback from daughter in law who reports they have all the services they need and "don't think they want to participate" in our program. CSW advised family member of reason for CSW referral and she indicates they "have home health, care in the home and friends connected to nursing homes who can help".  CSW will close CSW referral due to family declining visit or assessment.  CSW also updated [facility] RNCM of above who plans visit this week to the home.   CSW will update PCP and close CSW referral at this time.   Reece Levy, MSW, LCSW Clinical Social Worker  [facility] [employer] [phone]

## 2017-06-06 ENCOUNTER — Encounter: Payer: Self-pay | Admitting: *Deleted

## 2017-06-06 ENCOUNTER — Other Ambulatory Visit: Payer: Self-pay | Admitting: *Deleted

## 2017-06-06 ENCOUNTER — Ambulatory Visit: Payer: Self-pay | Admitting: *Deleted

## 2017-06-06 DIAGNOSIS — S82841D Displaced bimalleolar fracture of right lower leg, subsequent encounter for closed fracture with routine healing: Secondary | ICD-10-CM | POA: Diagnosis not present

## 2017-06-06 DIAGNOSIS — Z9181 History of falling: Secondary | ICD-10-CM | POA: Diagnosis not present

## 2017-06-06 DIAGNOSIS — F0390 Unspecified dementia without behavioral disturbance: Secondary | ICD-10-CM | POA: Diagnosis not present

## 2017-06-06 DIAGNOSIS — M1990 Unspecified osteoarthritis, unspecified site: Secondary | ICD-10-CM | POA: Diagnosis not present

## 2017-06-06 NOTE — Patient Outreach (Signed)
[facility]) Care Management  06/06/2017  Brandi Chang 01/11/1930 [id number]  1230 Placed follow up call to patient , caregiver contact [name], daughter in law,prior to scheduled visit on today, due to previous communication this week to Social worker about not wishing to participate  in program to clarify if this includes nursing care management .   Discussed with [name] regarding benefits of program, differences in [facility] and [facility] health , and we work together. She continues to decline continued participation in program at this time,offered follow up calls, she declined. She discussed patient has 24 sitters in place, home health services. Discussed Dementia resources she reports she is familiar with resources in area.   Family declining  services at this time, provided contact information for future needs.   Plan Will notify CMA, PCP  of case closure.    Egbert GaribaldiKimberly Glover, RN, [facility] Care Management,Care Management Coordinator  [phone]- Mobile [phone]- Toll Free Main Office

## 2017-06-07 DIAGNOSIS — Z9181 History of falling: Secondary | ICD-10-CM | POA: Diagnosis not present

## 2017-06-07 DIAGNOSIS — F0390 Unspecified dementia without behavioral disturbance: Secondary | ICD-10-CM | POA: Diagnosis not present

## 2017-06-07 DIAGNOSIS — S82841D Displaced bimalleolar fracture of right lower leg, subsequent encounter for closed fracture with routine healing: Secondary | ICD-10-CM | POA: Diagnosis not present

## 2017-06-07 DIAGNOSIS — M1990 Unspecified osteoarthritis, unspecified site: Secondary | ICD-10-CM | POA: Diagnosis not present

## 2017-06-12 DIAGNOSIS — Z9181 History of falling: Secondary | ICD-10-CM | POA: Diagnosis not present

## 2017-06-12 DIAGNOSIS — F0390 Unspecified dementia without behavioral disturbance: Secondary | ICD-10-CM | POA: Diagnosis not present

## 2017-06-12 DIAGNOSIS — S82841D Displaced bimalleolar fracture of right lower leg, subsequent encounter for closed fracture with routine healing: Secondary | ICD-10-CM | POA: Diagnosis not present

## 2017-06-12 DIAGNOSIS — M1990 Unspecified osteoarthritis, unspecified site: Secondary | ICD-10-CM | POA: Diagnosis not present

## 2017-06-14 ENCOUNTER — Ambulatory Visit: Payer: Self-pay | Admitting: *Deleted

## 2017-06-14 DIAGNOSIS — F0390 Unspecified dementia without behavioral disturbance: Secondary | ICD-10-CM | POA: Diagnosis not present

## 2017-06-14 DIAGNOSIS — Z9181 History of falling: Secondary | ICD-10-CM | POA: Diagnosis not present

## 2017-06-14 DIAGNOSIS — S82841D Displaced bimalleolar fracture of right lower leg, subsequent encounter for closed fracture with routine healing: Secondary | ICD-10-CM | POA: Diagnosis not present

## 2017-06-14 DIAGNOSIS — M1990 Unspecified osteoarthritis, unspecified site: Secondary | ICD-10-CM | POA: Diagnosis not present

## 2017-06-18 DIAGNOSIS — M1990 Unspecified osteoarthritis, unspecified site: Secondary | ICD-10-CM | POA: Diagnosis not present

## 2017-06-18 DIAGNOSIS — S82841D Displaced bimalleolar fracture of right lower leg, subsequent encounter for closed fracture with routine healing: Secondary | ICD-10-CM | POA: Diagnosis not present

## 2017-06-18 DIAGNOSIS — Z9181 History of falling: Secondary | ICD-10-CM | POA: Diagnosis not present

## 2017-06-18 DIAGNOSIS — F0390 Unspecified dementia without behavioral disturbance: Secondary | ICD-10-CM | POA: Diagnosis not present

## 2017-06-19 DIAGNOSIS — F0390 Unspecified dementia without behavioral disturbance: Secondary | ICD-10-CM | POA: Diagnosis not present

## 2017-06-19 DIAGNOSIS — Z9181 History of falling: Secondary | ICD-10-CM | POA: Diagnosis not present

## 2017-06-19 DIAGNOSIS — M1990 Unspecified osteoarthritis, unspecified site: Secondary | ICD-10-CM | POA: Diagnosis not present

## 2017-06-19 DIAGNOSIS — S82841D Displaced bimalleolar fracture of right lower leg, subsequent encounter for closed fracture with routine healing: Secondary | ICD-10-CM | POA: Diagnosis not present

## 2017-06-27 DIAGNOSIS — S82841D Displaced bimalleolar fracture of right lower leg, subsequent encounter for closed fracture with routine healing: Secondary | ICD-10-CM | POA: Diagnosis not present

## 2017-06-28 DIAGNOSIS — F0390 Unspecified dementia without behavioral disturbance: Secondary | ICD-10-CM | POA: Diagnosis not present

## 2017-06-28 DIAGNOSIS — M1990 Unspecified osteoarthritis, unspecified site: Secondary | ICD-10-CM | POA: Diagnosis not present

## 2017-06-28 DIAGNOSIS — Z9181 History of falling: Secondary | ICD-10-CM | POA: Diagnosis not present

## 2017-06-28 DIAGNOSIS — S82841D Displaced bimalleolar fracture of right lower leg, subsequent encounter for closed fracture with routine healing: Secondary | ICD-10-CM | POA: Diagnosis not present

## 2017-07-01 DIAGNOSIS — F0390 Unspecified dementia without behavioral disturbance: Secondary | ICD-10-CM | POA: Diagnosis not present

## 2017-07-01 DIAGNOSIS — S82841D Displaced bimalleolar fracture of right lower leg, subsequent encounter for closed fracture with routine healing: Secondary | ICD-10-CM | POA: Diagnosis not present

## 2017-07-01 DIAGNOSIS — Z9181 History of falling: Secondary | ICD-10-CM | POA: Diagnosis not present

## 2017-07-01 DIAGNOSIS — M1990 Unspecified osteoarthritis, unspecified site: Secondary | ICD-10-CM | POA: Diagnosis not present

## 2017-07-05 DIAGNOSIS — Z9181 History of falling: Secondary | ICD-10-CM | POA: Diagnosis not present

## 2017-07-05 DIAGNOSIS — S82841D Displaced bimalleolar fracture of right lower leg, subsequent encounter for closed fracture with routine healing: Secondary | ICD-10-CM | POA: Diagnosis not present

## 2017-07-05 DIAGNOSIS — M1990 Unspecified osteoarthritis, unspecified site: Secondary | ICD-10-CM | POA: Diagnosis not present

## 2017-07-05 DIAGNOSIS — F0390 Unspecified dementia without behavioral disturbance: Secondary | ICD-10-CM | POA: Diagnosis not present

## 2017-07-08 DIAGNOSIS — Z9181 History of falling: Secondary | ICD-10-CM | POA: Diagnosis not present

## 2017-07-08 DIAGNOSIS — S82841D Displaced bimalleolar fracture of right lower leg, subsequent encounter for closed fracture with routine healing: Secondary | ICD-10-CM | POA: Diagnosis not present

## 2017-07-08 DIAGNOSIS — F0390 Unspecified dementia without behavioral disturbance: Secondary | ICD-10-CM | POA: Diagnosis not present

## 2017-07-08 DIAGNOSIS — M1990 Unspecified osteoarthritis, unspecified site: Secondary | ICD-10-CM | POA: Diagnosis not present

## 2017-07-10 DIAGNOSIS — F0390 Unspecified dementia without behavioral disturbance: Secondary | ICD-10-CM | POA: Diagnosis not present

## 2017-07-10 DIAGNOSIS — S82841D Displaced bimalleolar fracture of right lower leg, subsequent encounter for closed fracture with routine healing: Secondary | ICD-10-CM | POA: Diagnosis not present

## 2017-07-10 DIAGNOSIS — Z9181 History of falling: Secondary | ICD-10-CM | POA: Diagnosis not present

## 2017-07-10 DIAGNOSIS — M1990 Unspecified osteoarthritis, unspecified site: Secondary | ICD-10-CM | POA: Diagnosis not present

## 2017-07-15 DIAGNOSIS — M1990 Unspecified osteoarthritis, unspecified site: Secondary | ICD-10-CM | POA: Diagnosis not present

## 2017-07-15 DIAGNOSIS — F0390 Unspecified dementia without behavioral disturbance: Secondary | ICD-10-CM | POA: Diagnosis not present

## 2017-07-15 DIAGNOSIS — S82841D Displaced bimalleolar fracture of right lower leg, subsequent encounter for closed fracture with routine healing: Secondary | ICD-10-CM | POA: Diagnosis not present

## 2017-07-15 DIAGNOSIS — Z9181 History of falling: Secondary | ICD-10-CM | POA: Diagnosis not present

## 2017-07-17 DIAGNOSIS — S82841D Displaced bimalleolar fracture of right lower leg, subsequent encounter for closed fracture with routine healing: Secondary | ICD-10-CM | POA: Diagnosis not present

## 2017-07-17 DIAGNOSIS — M1990 Unspecified osteoarthritis, unspecified site: Secondary | ICD-10-CM | POA: Diagnosis not present

## 2017-07-17 DIAGNOSIS — F0390 Unspecified dementia without behavioral disturbance: Secondary | ICD-10-CM | POA: Diagnosis not present

## 2017-07-17 DIAGNOSIS — Z9181 History of falling: Secondary | ICD-10-CM | POA: Diagnosis not present

## 2017-07-23 DIAGNOSIS — S82841D Displaced bimalleolar fracture of right lower leg, subsequent encounter for closed fracture with routine healing: Secondary | ICD-10-CM | POA: Diagnosis not present

## 2017-07-23 DIAGNOSIS — Z9181 History of falling: Secondary | ICD-10-CM | POA: Diagnosis not present

## 2017-07-23 DIAGNOSIS — F0390 Unspecified dementia without behavioral disturbance: Secondary | ICD-10-CM | POA: Diagnosis not present

## 2017-07-23 DIAGNOSIS — M1990 Unspecified osteoarthritis, unspecified site: Secondary | ICD-10-CM | POA: Diagnosis not present

## 2017-07-24 DIAGNOSIS — S82841D Displaced bimalleolar fracture of right lower leg, subsequent encounter for closed fracture with routine healing: Secondary | ICD-10-CM | POA: Diagnosis not present

## 2017-07-30 DIAGNOSIS — Z23 Encounter for immunization: Secondary | ICD-10-CM | POA: Diagnosis not present

## 2017-08-21 DIAGNOSIS — S82841D Displaced bimalleolar fracture of right lower leg, subsequent encounter for closed fracture with routine healing: Secondary | ICD-10-CM | POA: Diagnosis not present

## 2017-09-27 DIAGNOSIS — F0390 Unspecified dementia without behavioral disturbance: Secondary | ICD-10-CM | POA: Diagnosis not present

## 2017-09-27 DIAGNOSIS — M109 Gout, unspecified: Secondary | ICD-10-CM | POA: Diagnosis not present

## 2017-09-27 DIAGNOSIS — R52 Pain, unspecified: Secondary | ICD-10-CM | POA: Diagnosis not present

## 2017-09-27 DIAGNOSIS — E162 Hypoglycemia, unspecified: Secondary | ICD-10-CM | POA: Diagnosis not present

## 2017-09-27 DIAGNOSIS — E7800 Pure hypercholesterolemia, unspecified: Secondary | ICD-10-CM | POA: Diagnosis not present

## 2017-09-27 DIAGNOSIS — E039 Hypothyroidism, unspecified: Secondary | ICD-10-CM | POA: Diagnosis not present

## 2017-09-27 DIAGNOSIS — E119 Type 2 diabetes mellitus without complications: Secondary | ICD-10-CM | POA: Diagnosis not present

## 2017-10-24 DIAGNOSIS — R52 Pain, unspecified: Secondary | ICD-10-CM | POA: Diagnosis not present

## 2017-10-24 DIAGNOSIS — M549 Dorsalgia, unspecified: Secondary | ICD-10-CM | POA: Diagnosis not present

## 2017-10-24 DIAGNOSIS — R310 Gross hematuria: Secondary | ICD-10-CM | POA: Diagnosis not present

## 2017-11-19 ENCOUNTER — Ambulatory Visit: Payer: Self-pay | Admitting: Urology

## 2017-11-19 ENCOUNTER — Encounter: Payer: Self-pay | Admitting: Urology

## 2017-11-19 ENCOUNTER — Ambulatory Visit (INDEPENDENT_AMBULATORY_CARE_PROVIDER_SITE_OTHER): Payer: Medicare Other | Admitting: Urology

## 2017-11-19 VITALS — BP 176/71 | HR 73 | Wt 120.0 lb

## 2017-11-19 DIAGNOSIS — N281 Cyst of kidney, acquired: Secondary | ICD-10-CM

## 2017-11-19 DIAGNOSIS — R3129 Other microscopic hematuria: Secondary | ICD-10-CM

## 2017-11-19 LAB — URINALYSIS, COMPLETE
Bilirubin, UA: NEGATIVE
GLUCOSE, UA: NEGATIVE
KETONES UA: NEGATIVE
Leukocytes, UA: NEGATIVE
NITRITE UA: NEGATIVE
Protein, UA: NEGATIVE
SPEC GRAV UA: 1.02 (ref 1.005–1.030)
Urobilinogen, Ur: 0.2 mg/dL (ref 0.2–1.0)
pH, UA: 5.5 (ref 5.0–7.5)

## 2017-11-19 LAB — MICROSCOPIC EXAMINATION
Bacteria, UA: NONE SEEN
Epithelial Cells (non renal): NONE SEEN /hpf (ref 0–10)
WBC UA: NONE SEEN /HPF (ref 0–?)

## 2017-11-19 NOTE — Progress Notes (Signed)
11/19/2017 5:24 PM   Brandi Chang 07-17-1930 [id number]  Referring provider: Marin Comment, FNP [address] [place], [place] [address]  Chief Complaint  Patient presents with  . Renal Cyst    New Patient    HPI: 84 year old female with dementia referred for further evaluation of incidental cyst as well as possible microscopic hematuria workup.  She was seen and evaluated by her PCP complaining of lower back pain/tailbone pain after recent fall.  As part of the workup, she presumably had a urine although the results were not sent today.  There is concern for microscopic hematuria but it is unclear whether this was a different micro scopic examination.  Presumably as further evaluation for this, she had a duplex renal Doppler study which showed an incidental 5 x 5.4 right upper pole simple renal cyst.  This is by report only, images not available today.  She denies any flank pain, gross hematuria, or any other urinary symptoms.  She has no personal history of kidney stones.  She is a never smoker.  UA was repeated today, this demonstrated 1+ blood on dip, however on microscopy, no red blood cells were identified.   PMH: Past Medical History:  Diagnosis Date  . Arthritis     Surgical History: Past Surgical History:  Procedure Laterality Date  . ORIF ANKLE FRACTURE Right 04/28/2017   Procedure: OPEN REDUCTION INTERNAL FIXATION (ORIF) ANKLE FRACTURE;  Surgeon: Juanell Fairly, MD;  Location: [facility] ORS;  Service: Orthopedics;  Laterality: Right;    Home Medications:  Allergies as of 11/19/2017   No Known Allergies     Medication List        Accurate as of 11/19/17  5:24 PM. Always use your most recent med list.          divalproex 125 MG DR tablet Commonly known as:  DEPAKOTE Take 125 mg by mouth 2 (two) times daily.   docusate sodium 100 MG capsule Commonly known as:  COLACE Take 1 capsule (100 mg total) by mouth 2 (two) times daily.   donepezil 5 MG  tablet Commonly known as:  ARICEPT Take 5 mg by mouth at bedtime.   enoxaparin 40 MG/0.4ML injection Commonly known as:  LOVENOX Inject 0.4 mLs (40 mg total) into the skin daily.   LORazepam 1 MG tablet Commonly known as:  ATIVAN Take 1 tablet (1 mg total) by mouth every 6 (six) hours as needed for anxiety (agitation).   meloxicam 15 MG tablet Commonly known as:  MOBIC Take 1 tablet by mouth daily.   methocarbamol 500 MG tablet Commonly known as:  ROBAXIN Take 1 tablet (500 mg total) by mouth every 6 (six) hours as needed for muscle spasms.   traZODone 50 MG tablet Commonly known as:  DESYREL Take 25 mg by mouth at bedtime. Take 25 mg each evening       Allergies: No Known Allergies  Family History: No family history on file.  Social History:  reports that  has never smoked. she has never used smokeless tobacco. She reports that she does not drink alcohol or use drugs.  ROS: UROLOGY Frequent Urination?: No Hard to postpone urination?: No Burning/pain with urination?: No Get up at night to urinate?: No Leakage of urine?: No Urine stream starts and stops?: No Trouble starting stream?: No Do you have to strain to urinate?: No Blood in urine?: Yes Urinary tract infection?: No Sexually transmitted disease?: No Injury to kidneys or bladder?: No Painful intercourse?: No Weak  stream?: No Currently pregnant?: No Vaginal bleeding?: No Last menstrual period?: n  Gastrointestinal Nausea?: No Vomiting?: No Indigestion/heartburn?: No Diarrhea?: No Constipation?: No  Constitutional Fever: No Night sweats?: No Weight loss?: No Fatigue?: No  Skin Skin rash/lesions?: No  Eyes Blurred vision?: No Double vision?: No  Ears/Nose/Throat Sore throat?: No Sinus problems?: No  Hematologic/Lymphatic Swollen glands?: No Easy bruising?: No  Cardiovascular Leg swelling?: No Chest pain?: No  Respiratory Cough?: No Shortness of breath?:  No  Endocrine Excessive thirst?: No  Musculoskeletal Back pain?: No Joint pain?: No  Neurological Headaches?: No Dizziness?: No  Psychologic Depression?: No Anxiety?: No  Physical Exam: BP (!) 176/71   Pulse 73   Wt 120 lb (54.4 kg)   BMI 21.95 kg/m   Constitutional:  Alert and oriented, No acute distress.  Well-dressed.  Alert and oriented but relatively poor historian.  Accompanied by 2 sons today. HEENT: [place] AT, moist mucus membranes.  Trachea midline, no masses. Cardiovascular: No clubbing, cyanosis, or edema. Respiratory: Normal respiratory effort, no increased work of breathing. GI: Abdomen is soft, nontender, nondistended, no abdominal masses GU: No CVA tenderness. Skin: No rashes, bruises or suspicious lesions. Neurologic: Grossly intact, no focal deficits, moving all 4 extremities. Psychiatric: Normal mood and affect.  Laboratory Data: Lab Results  Component Value Date   WBC 8.0 05/01/2017   HGB 12.1 05/01/2017   HCT 35.6 05/01/2017   MCV 96.1 05/01/2017   PLT 236 05/01/2017    Lab Results  Component Value Date   CREATININE 0.60 04/30/2017    Urinalysis Lab Results  Component Value Date   SPECGRAV 1.020 11/19/2017   PHUR 5.5 11/19/2017   COLORU Yellow 11/19/2017   APPEARANCEUR Clear 11/19/2017   LEUKOCYTESUR Negative 11/19/2017   PROTEINUR Negative 11/19/2017   GLUCOSEU Negative 11/19/2017   KETONESU Negative 11/19/2017   RBCU 1+ (A) 11/19/2017   BILIRUBINUR Negative 11/19/2017   UUROB 0.2 11/19/2017   NITRITE Negative 11/19/2017    Lab Results  Component Value Date   LABMICR See below: 11/19/2017   WBCUA None seen 11/19/2017   RBCUA 0-2 11/19/2017   LABEPIT None seen 11/19/2017   MUCUS Present (A) 11/19/2017   BACTERIA None seen 11/19/2017    Pertinent Imaging: N/a  Assessment & Plan:    1. Microscopic hematuria Presumably referred for microscopic hematuria, although urinalysis was not sent UA today shows 1+ blood but no red  blood cells on microscopy Suspect this it may been the case that her primary care physicians as well At this point in time, no further workup is indicated Would recommend repeat UA with microscopy in 1 year by PCP, refer back if there is evidence of greater than 3 red blood cells per high-powered field on microscopic exam - Urinalysis, Complete  2. Renal cyst Per report, this is a 5 cm simple renal cyst the right upper pole Given absence of pathological features and her age and comorbidities, would not recommend any further follow-up  F/u prn  Vanna ScotlandAshley Huber Mathers, MD  [facility] [address], [address] [place], [place] [address] [phone]

## 2018-01-07 DIAGNOSIS — E559 Vitamin D deficiency, unspecified: Secondary | ICD-10-CM | POA: Diagnosis not present

## 2018-01-07 DIAGNOSIS — Z6823 Body mass index (BMI) 23.0-23.9, adult: Secondary | ICD-10-CM | POA: Diagnosis not present

## 2018-01-07 DIAGNOSIS — I10 Essential (primary) hypertension: Secondary | ICD-10-CM | POA: Diagnosis not present

## 2018-01-07 DIAGNOSIS — F0390 Unspecified dementia without behavioral disturbance: Secondary | ICD-10-CM | POA: Diagnosis not present

## 2018-05-28 DIAGNOSIS — Z6822 Body mass index (BMI) 22.0-22.9, adult: Secondary | ICD-10-CM | POA: Diagnosis not present

## 2018-05-28 DIAGNOSIS — F0390 Unspecified dementia without behavioral disturbance: Secondary | ICD-10-CM | POA: Diagnosis not present

## 2018-05-28 DIAGNOSIS — F329 Major depressive disorder, single episode, unspecified: Secondary | ICD-10-CM | POA: Diagnosis not present

## 2018-05-28 DIAGNOSIS — M159 Polyosteoarthritis, unspecified: Secondary | ICD-10-CM | POA: Diagnosis not present

## 2018-08-22 ENCOUNTER — Other Ambulatory Visit: Payer: Self-pay

## 2018-09-17 DIAGNOSIS — L84 Corns and callosities: Secondary | ICD-10-CM | POA: Diagnosis not present

## 2018-09-17 DIAGNOSIS — F0390 Unspecified dementia without behavioral disturbance: Secondary | ICD-10-CM | POA: Diagnosis not present

## 2018-09-17 DIAGNOSIS — Z23 Encounter for immunization: Secondary | ICD-10-CM | POA: Diagnosis not present

## 2018-09-17 DIAGNOSIS — M2040 Other hammer toe(s) (acquired), unspecified foot: Secondary | ICD-10-CM | POA: Diagnosis not present

## 2018-10-14 DIAGNOSIS — M216X1 Other acquired deformities of right foot: Secondary | ICD-10-CM | POA: Diagnosis not present

## 2018-10-14 DIAGNOSIS — L84 Corns and callosities: Secondary | ICD-10-CM | POA: Diagnosis not present

## 2018-11-25 DIAGNOSIS — L84 Corns and callosities: Secondary | ICD-10-CM | POA: Diagnosis not present

## 2018-11-25 DIAGNOSIS — M216X1 Other acquired deformities of right foot: Secondary | ICD-10-CM | POA: Diagnosis not present

## 2019-07-02 IMAGING — CR DG ANKLE COMPLETE 3+V*R*
1 series · 3 of 3 positions shown · non-contrast
Comparison: None.

CLINICAL DATA: Patient slipped and fell on right ankle with
swelling and bruising.

EXAM:
RIGHT ANKLE - COMPLETE 3+ VIEW

[Series 1: dg ankle complete right · 0.14mm/px · 3 of 3 slices shown]
[im 1/3]
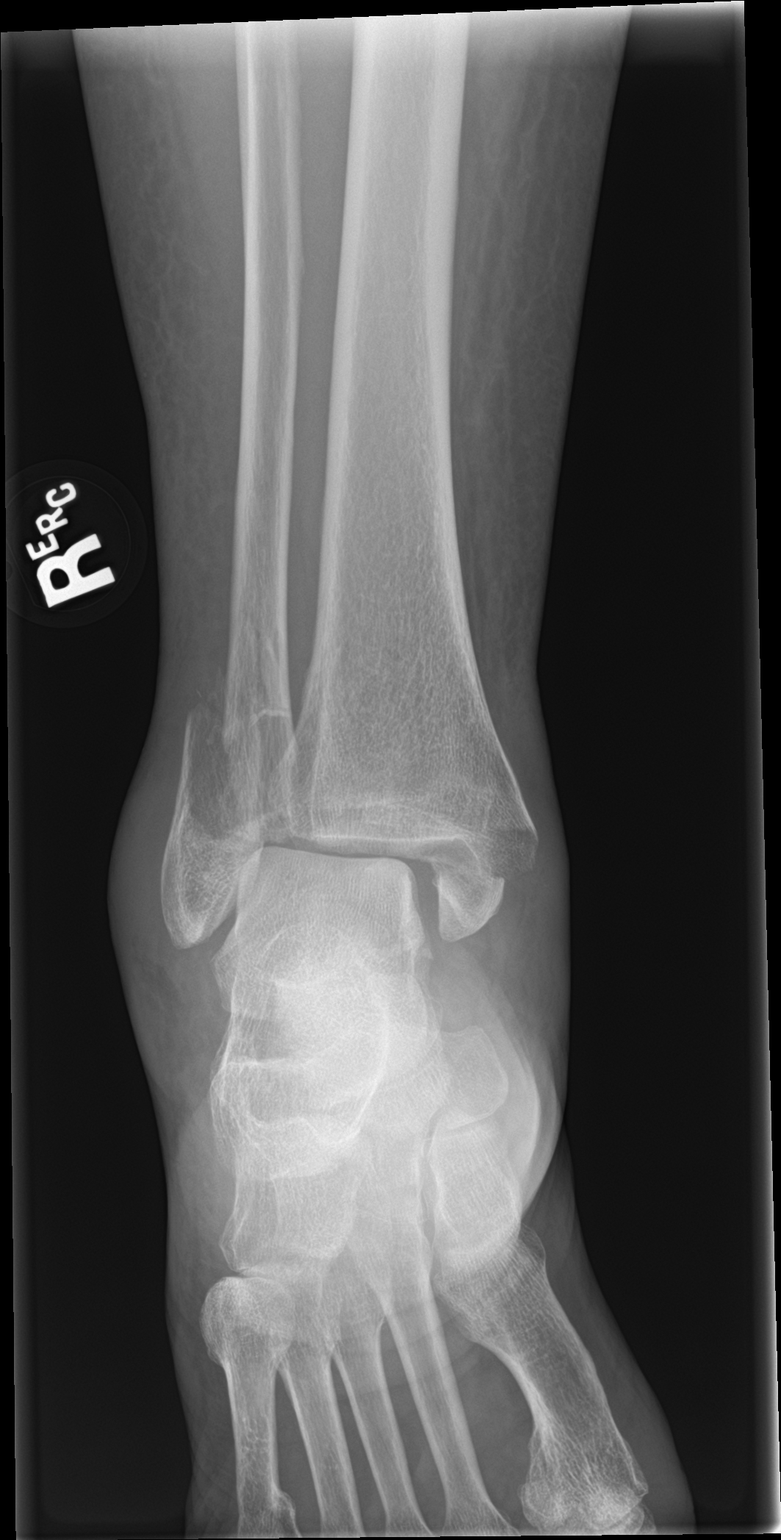
[im 2/3]
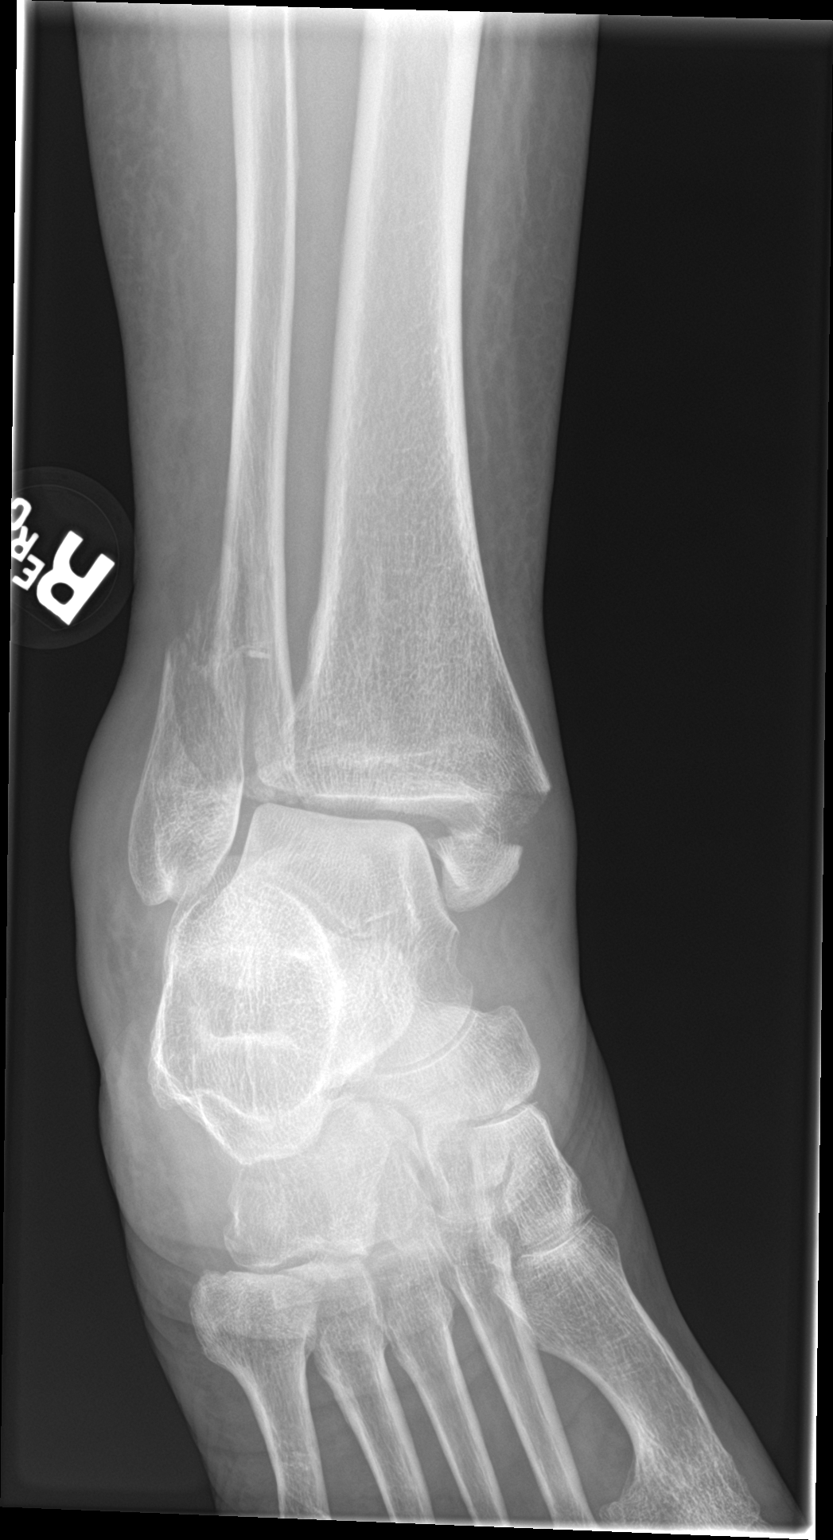
[im 3/3]
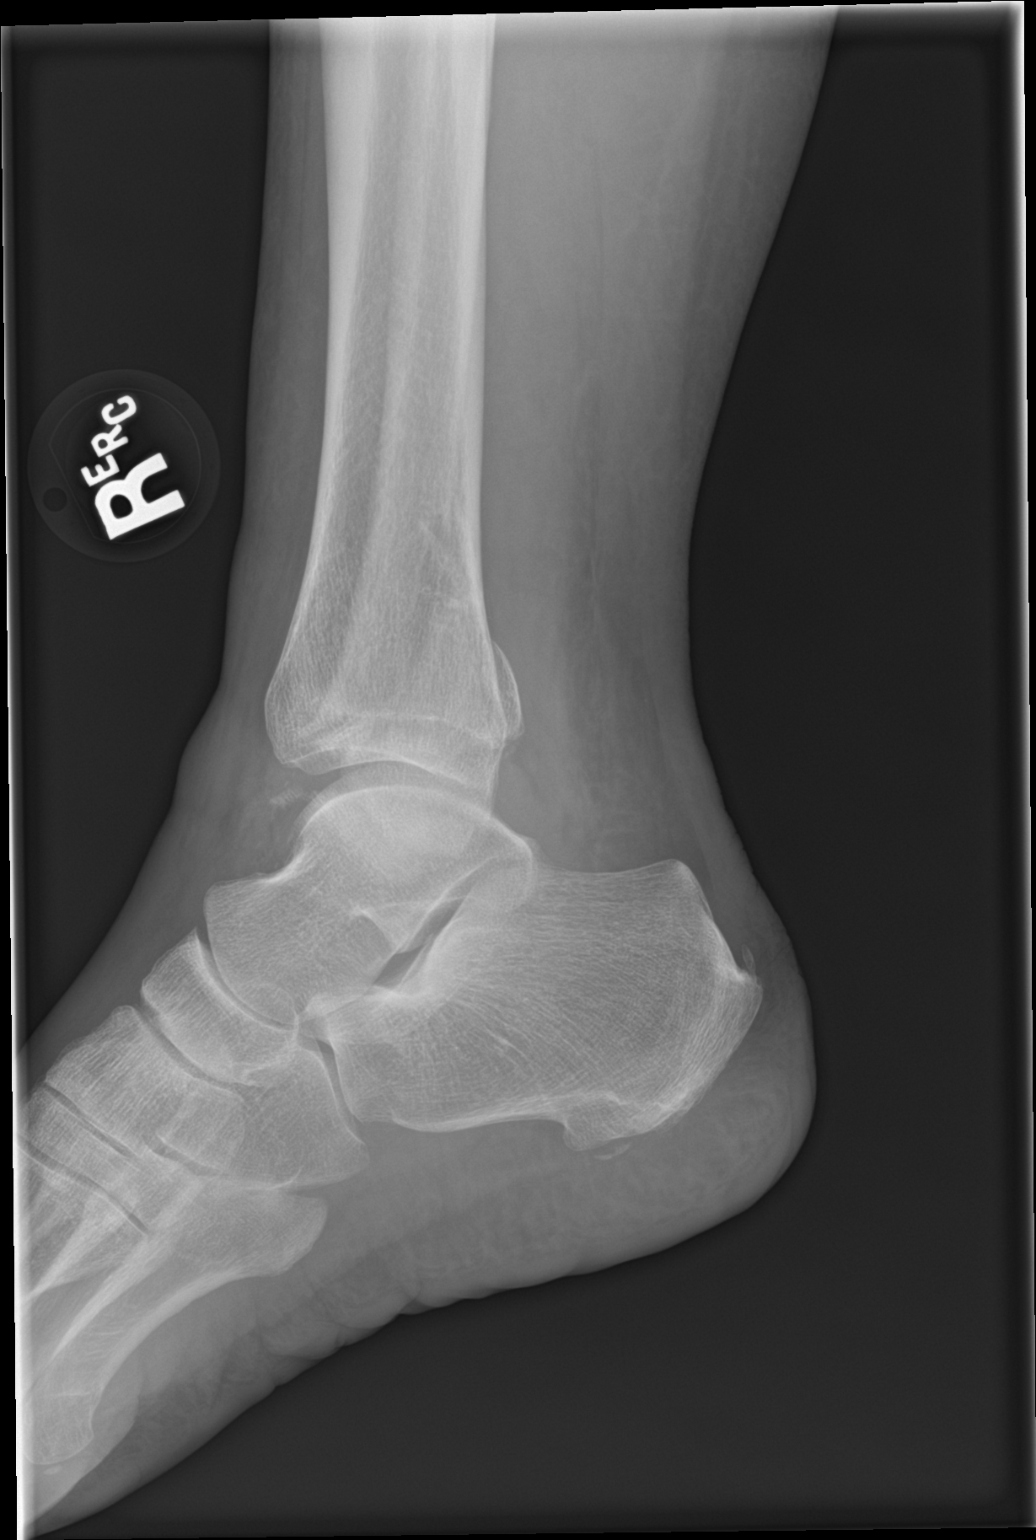

[3 of 3 positions shown; findings below may reference images not displayed]

FINDINGS: Acute, bimalleolar closed fracture-subluxation of the ankle joint is
noted with an oblique intra-articular fracture involving the distal
fibular metaphysis extending into the ankle joint with [DATE] shaft
wdith lateral displacement of the fibular tip. A transverse fracture
through the medial malleolus is also noted with widening of the
medial clear space of the ankle joint up to 13 mm from lateral
subluxation of the talar dome relative to the tibial plafond. Tiny
ossific fracture fragments are noted along the anterior and medial
aspect of the ankle joint. There is periarticular soft tissues
swelling secondary to fracture. The posterior malleolar fracture is
not conclusively identified. Calcaneal enthesophytes are seen along
the plantar dorsal aspect.
IMPRESSION: 1. Acute, closed, bimalleolar fracture- lateral subluxation of the
ankle joint is noted with an oblique intra-articular fracture
involving the distal fibular metaphysis extending into the ankle
joint and [DATE] shaft width lateral displacement of the fibular tip.
2. A transverse fracture through the medial malleolus is also noted
with widening of the medial clear space of the ankle joint up to 13
mm from lateral subluxation of the talar dome relative to the tibial
plafond.
3. No definite posterior malleolar fracture though could be occult
due to overlap of osseous elements.
4. Small ankle joint effusion with tiny ossific intra-articular
loose bodies noted anteriorly and possibly medially.

## 2019-07-02 IMAGING — CR DG ANKLE 2V *R*
2 series · 2 of 2 positions shown · non-contrast
Comparison: Ankle radiograph 04/27/2017

CLINICAL DATA: Ankle fracture, post reduction

EXAM:
RIGHT ANKLE - 2 VIEW

[ankle ap]
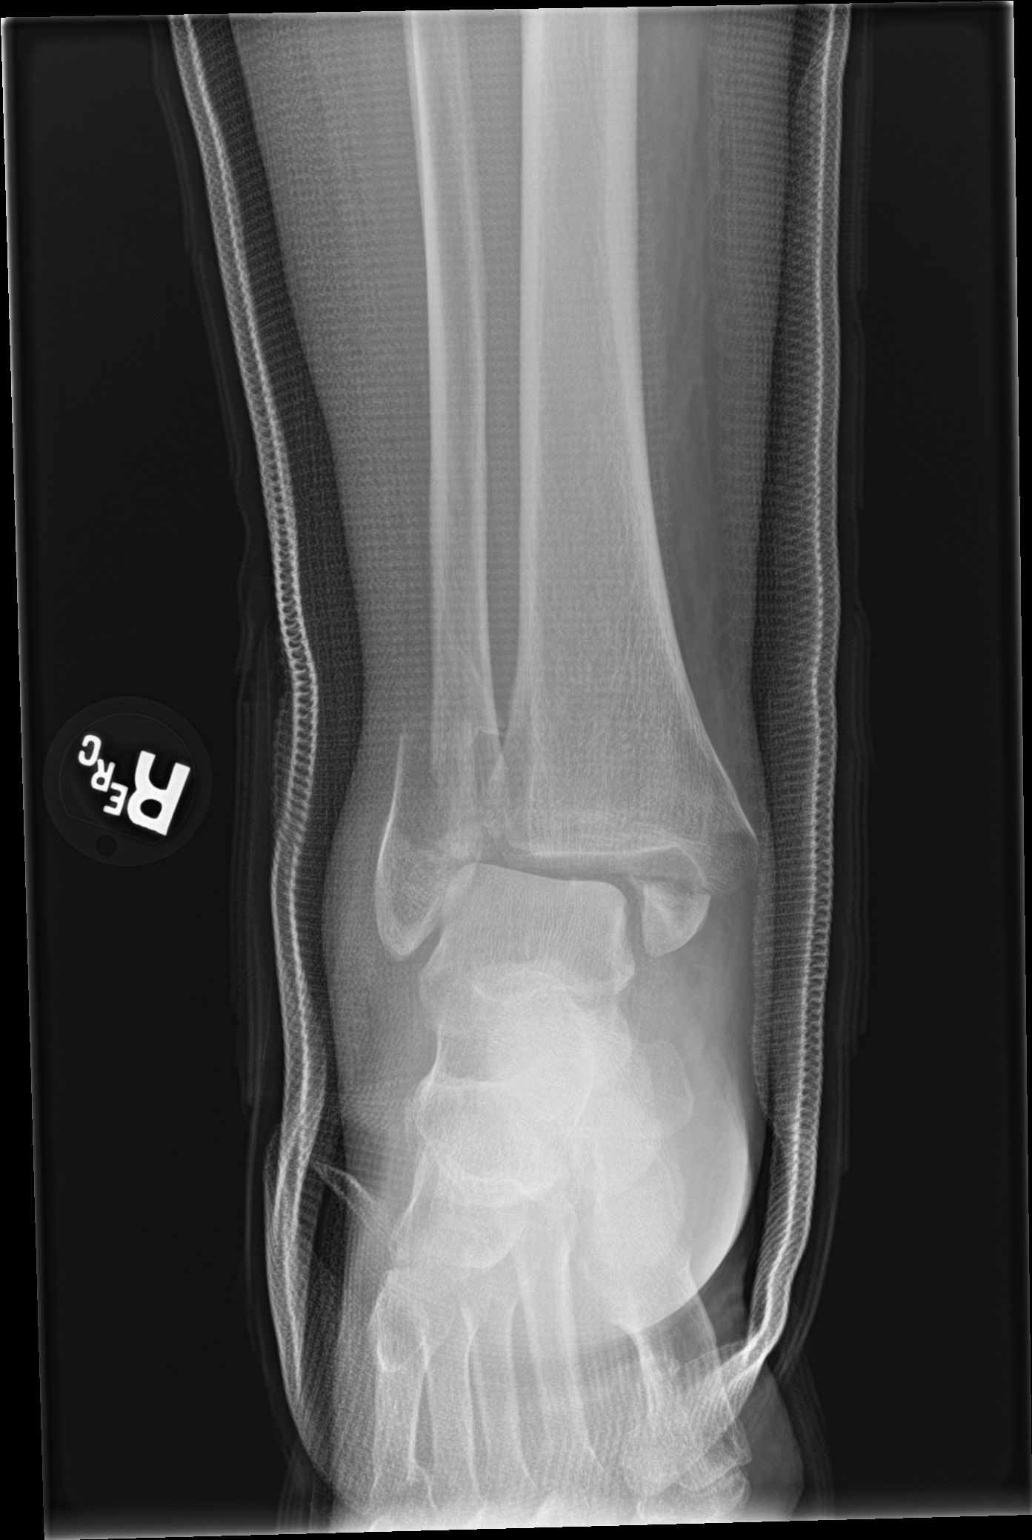

[ankle lat]
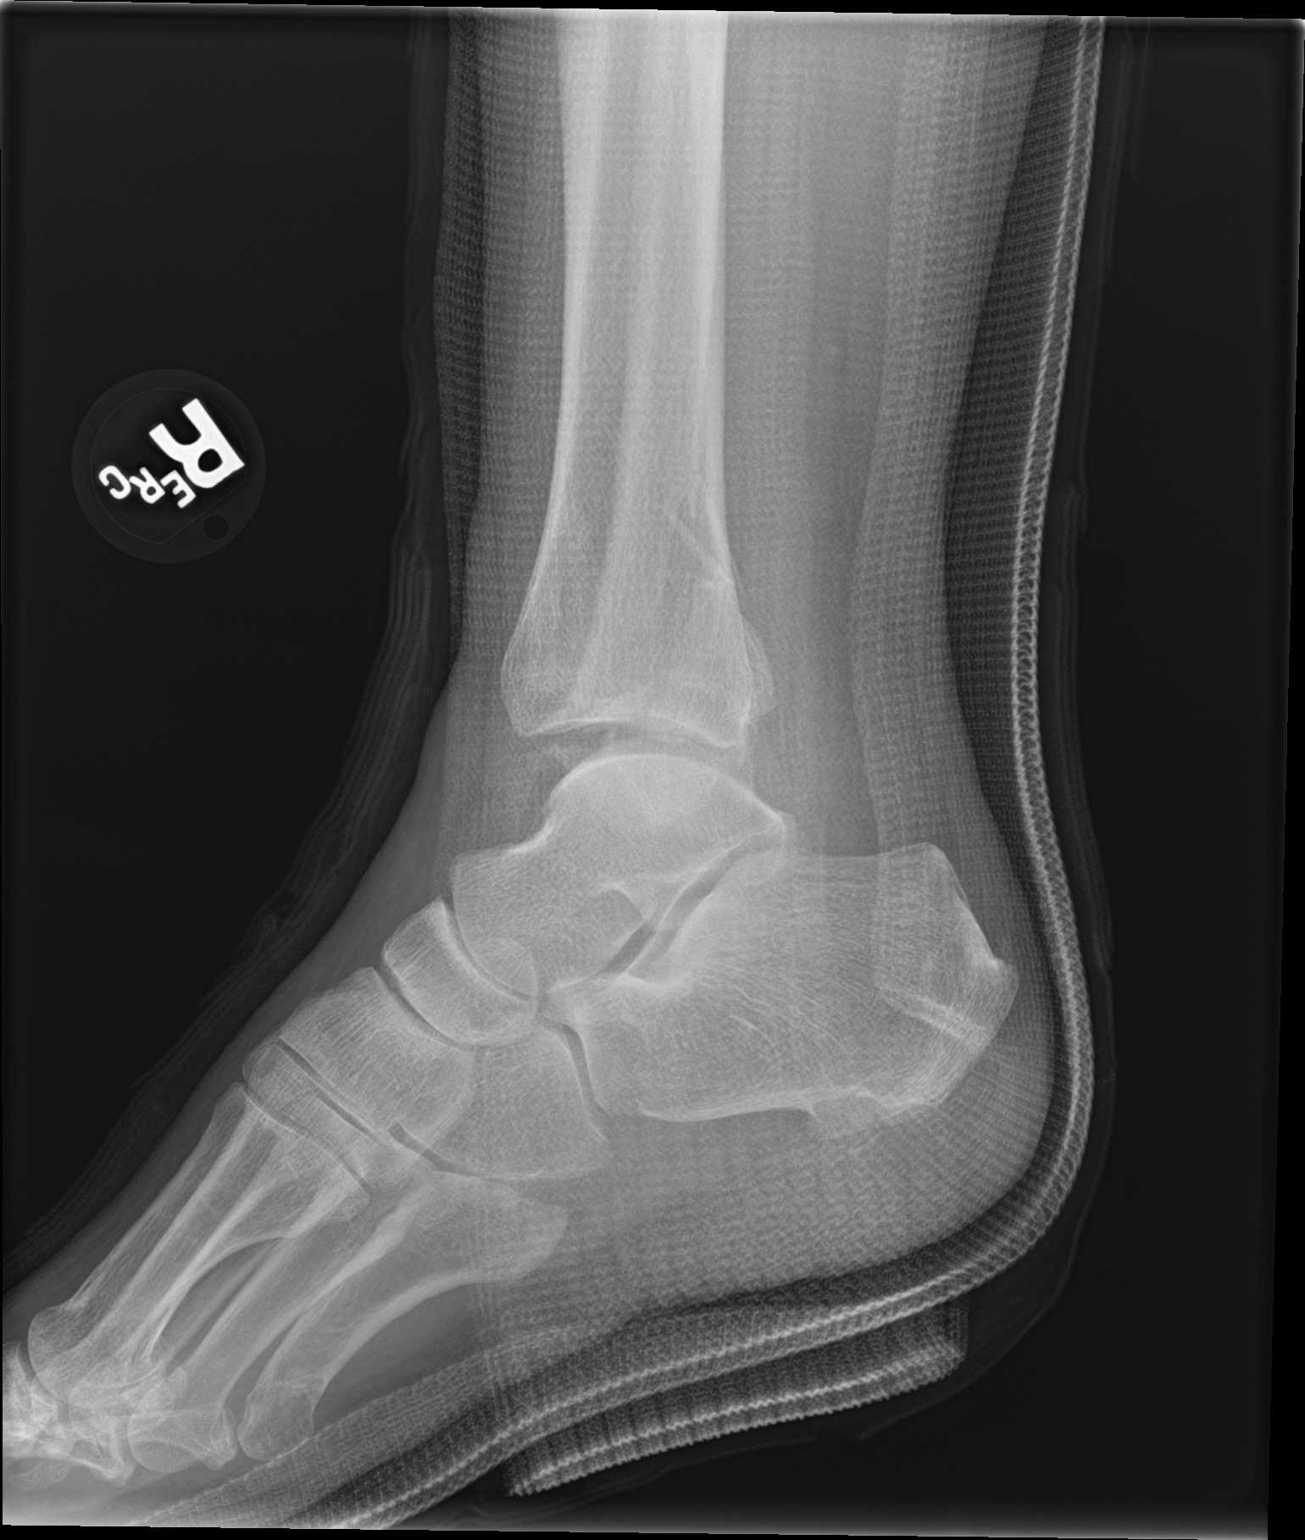

[2 of 2 positions shown; findings below may reference images not displayed]

FINDINGS: The alignment of the ankle fracture involving both malleoli is
unchanged. There is persistent medial displacement of the talus
relative to the ankle mortise. The distal fracture fragment of the
right fibula is also displaced laterally. There is circumferential
soft tissue swelling.
IMPRESSION: Unchanged alignment of the bimalleolar right ankle fracture.

## 2019-07-03 IMAGING — DX DG ANKLE COMPLETE 3+V*R*
3 series · 3 of 3 positions shown · non-contrast
Comparison: 04/27/2017

CLINICAL DATA: Bimalleolar RIGHT ankle fracture post ORIF

EXAM:
RIGHT ANKLE - COMPLETE 3+ VIEW

[ankle ap]
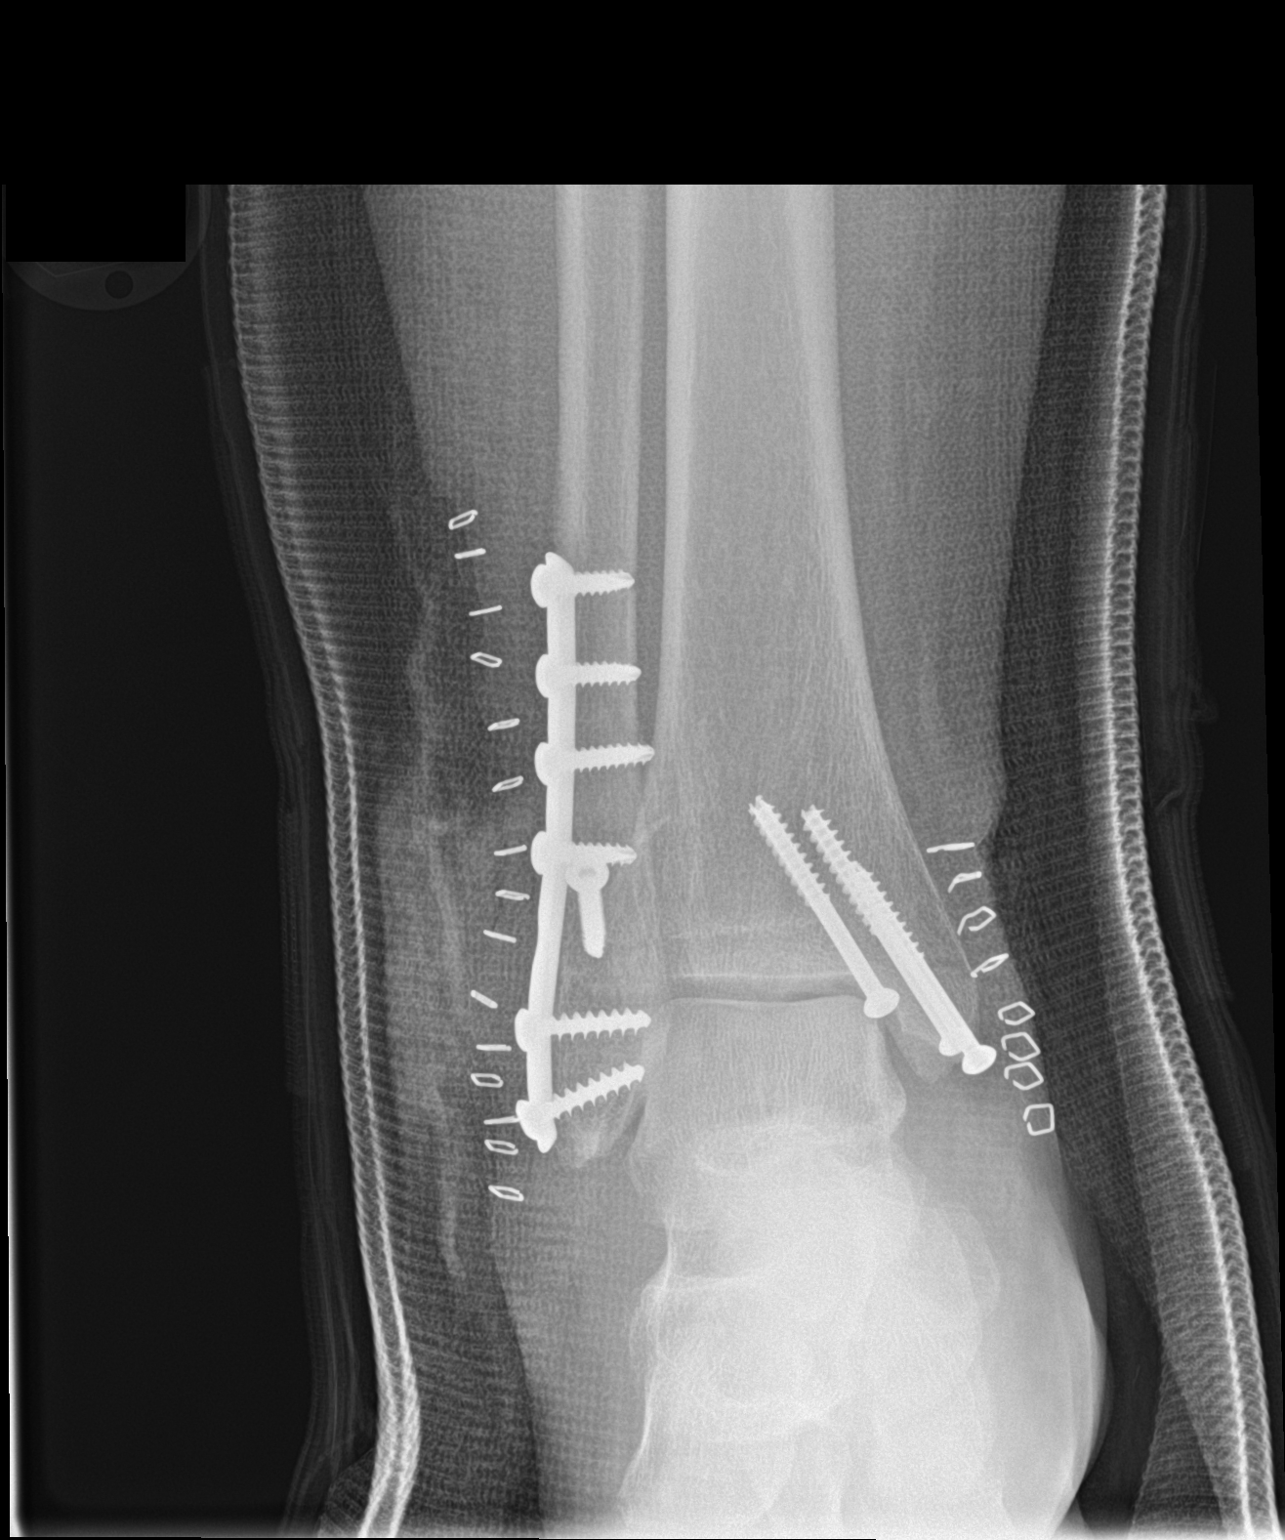

[ankle obl]
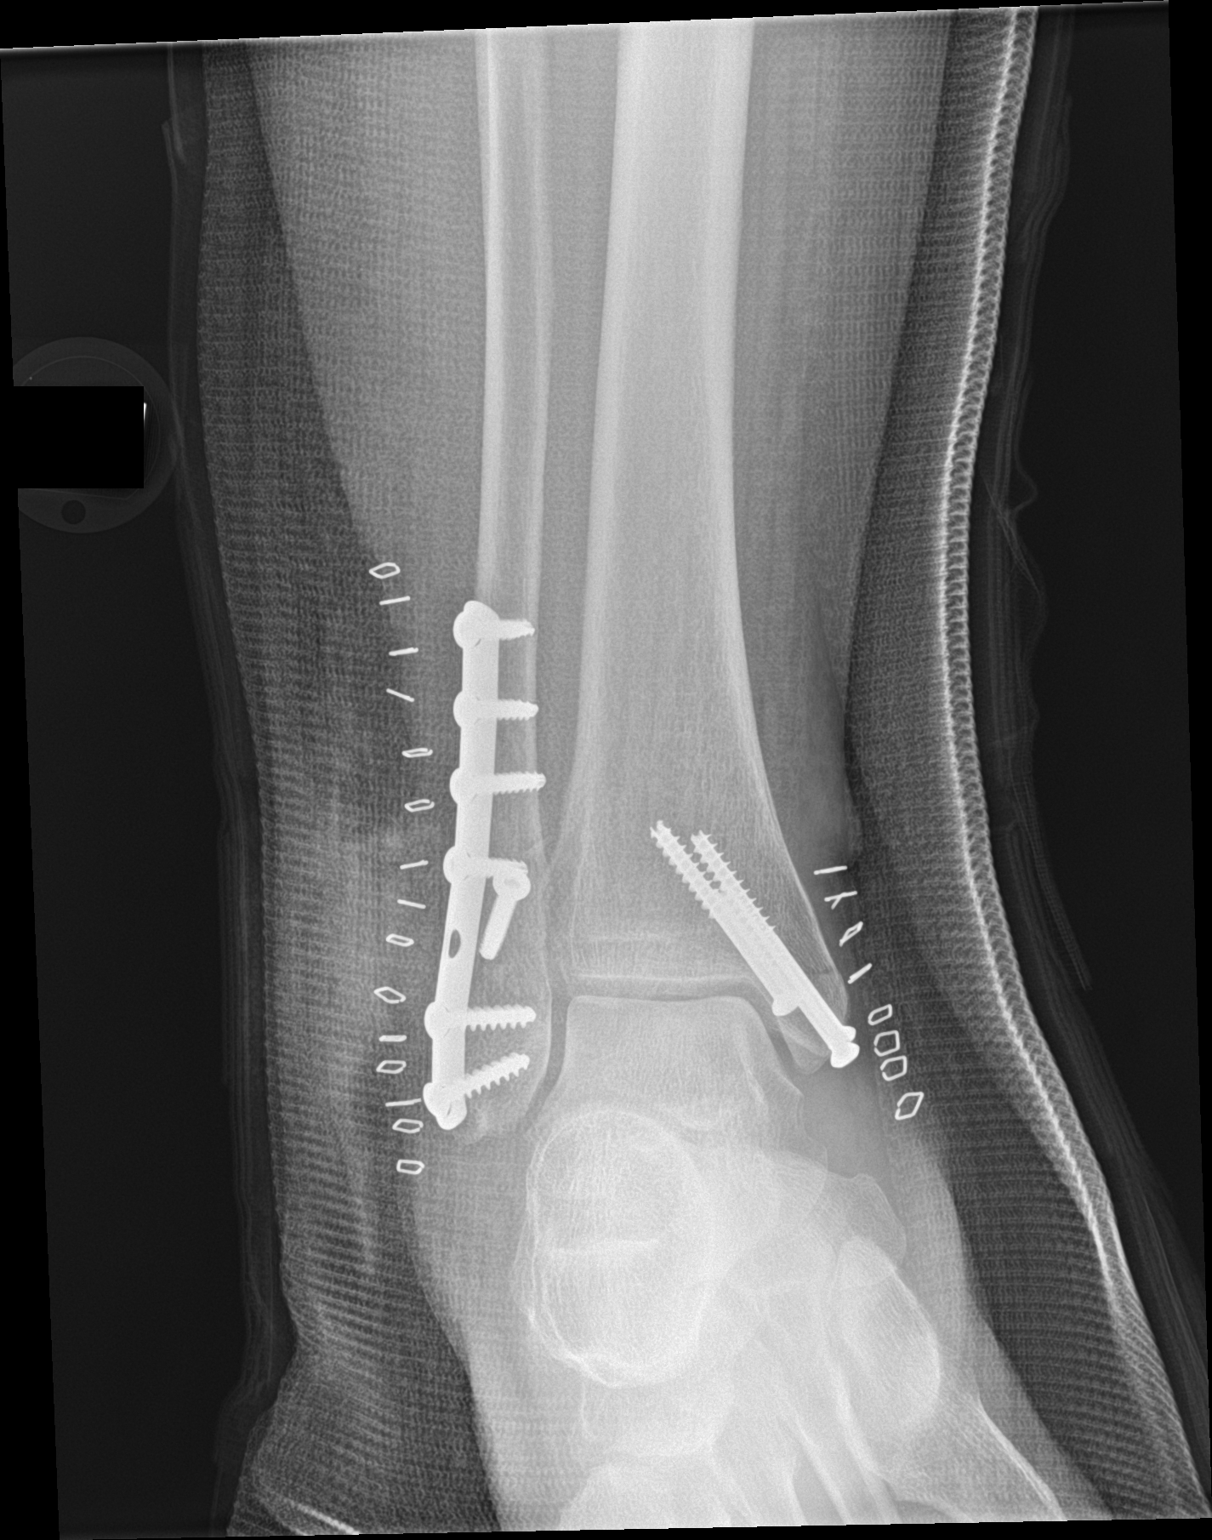

[ankle lat]
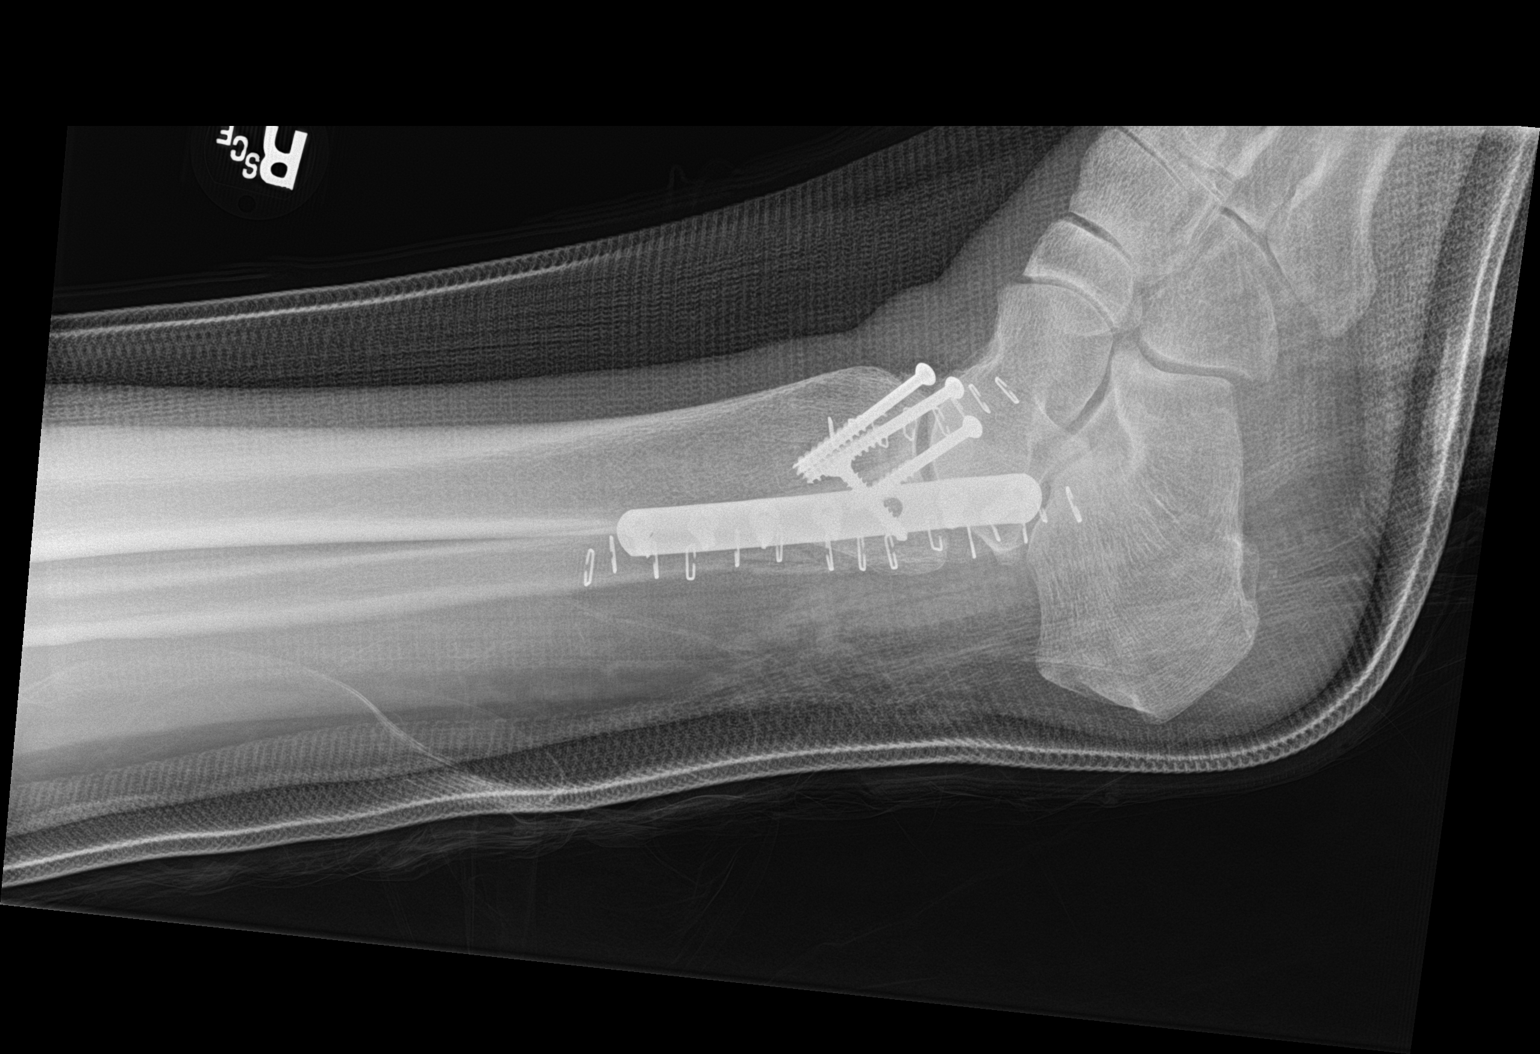

[3 of 3 positions shown; findings below may reference images not displayed]

FINDINGS: Bone detail limited by fiberglass cast material.

Three cannulated screws present across a reduced fracture of the
medial malleolus.

Lateral plate and multiple screws present across a reduced fracture
of the lateral malleolus.

Ankle joint space now normally aligned.

No additional fracture, dislocation, or bone destruction identified.
IMPRESSION: Post bimalleolar ORIF.

## 2020-11-01 DIAGNOSIS — F0390 Unspecified dementia without behavioral disturbance: Secondary | ICD-10-CM | POA: Diagnosis not present

## 2020-11-01 DIAGNOSIS — R0989 Other specified symptoms and signs involving the circulatory and respiratory systems: Secondary | ICD-10-CM | POA: Diagnosis not present

## 2020-11-01 DIAGNOSIS — M159 Polyosteoarthritis, unspecified: Secondary | ICD-10-CM | POA: Diagnosis not present

## 2020-11-01 DIAGNOSIS — Z6820 Body mass index (BMI) 20.0-20.9, adult: Secondary | ICD-10-CM | POA: Diagnosis not present

## 2021-05-25 DIAGNOSIS — F0390 Unspecified dementia without behavioral disturbance: Secondary | ICD-10-CM | POA: Diagnosis not present

## 2021-05-25 DIAGNOSIS — Z79899 Other long term (current) drug therapy: Secondary | ICD-10-CM | POA: Diagnosis not present

## 2021-05-25 DIAGNOSIS — Z6822 Body mass index (BMI) 22.0-22.9, adult: Secondary | ICD-10-CM | POA: Diagnosis not present

## 2021-05-25 DIAGNOSIS — M159 Polyosteoarthritis, unspecified: Secondary | ICD-10-CM | POA: Diagnosis not present

## 2021-11-07 ENCOUNTER — Ambulatory Visit: Payer: Medicare Other | Admitting: Podiatry

## 2021-11-09 ENCOUNTER — Other Ambulatory Visit: Payer: Self-pay

## 2021-11-09 ENCOUNTER — Ambulatory Visit (INDEPENDENT_AMBULATORY_CARE_PROVIDER_SITE_OTHER): Payer: Medicare Other | Admitting: Podiatry

## 2021-11-09 ENCOUNTER — Encounter: Payer: Self-pay | Admitting: Podiatry

## 2021-11-09 DIAGNOSIS — M79675 Pain in left toe(s): Secondary | ICD-10-CM | POA: Diagnosis not present

## 2021-11-09 DIAGNOSIS — B351 Tinea unguium: Secondary | ICD-10-CM | POA: Diagnosis not present

## 2021-11-09 DIAGNOSIS — M79674 Pain in right toe(s): Secondary | ICD-10-CM

## 2021-11-09 NOTE — Progress Notes (Signed)
°  Subjective:  Patient ID: Brandi Chang, female    DOB: 27-Jun-1930,  MRN: [id number]  Chief Complaint  Patient presents with   Nail Problem    Nail trim    88 y.o. female returns for the above complaint.  Patient presents with thickened elongated dystrophic toenails x10.  Pain on palpation.  Patient would like to have them debrided down she is not able to do it herself.  She is not a diabetic.  She denies any other acute issues  Objective:  There were no vitals filed for this visit. Podiatric Exam: Vascular: dorsalis pedis and posterior tibial pulses are palpable bilateral. Capillary return is immediate. Temperature gradient is WNL. Skin turgor WNL  Sensorium: Normal Semmes Weinstein monofilament test. Normal tactile sensation bilaterally. Nail Exam: Pt has thick disfigured discolored nails with subungual debris noted bilateral entire nail hallux through fifth toenails.  Pain on palpation to the nails. Ulcer Exam: There is no evidence of ulcer or pre-ulcerative changes or infection. Orthopedic Exam: Muscle tone and strength are WNL. No limitations in general ROM. No crepitus or effusions noted.  Skin: No Porokeratosis. No infection or ulcers    Assessment & Plan:   1. Pain due to onychomycosis of toenails of both feet     Patient was evaluated and treated and all questions answered.  Onychomycosis with pain  -Nails palliatively debrided as below. -Educated on self-care  Procedure: Nail Debridement Rationale: pain  Type of Debridement: manual, sharp debridement. Instrumentation: Nail nipper, rotary burr. Number of Nails: 10  Procedures and Treatment: Consent by patient was obtained for treatment procedures. The patient understood the discussion of treatment and procedures well. All questions were answered thoroughly reviewed. Debridement of mycotic and hypertrophic toenails, 1 through 5 bilateral and clearing of subungual debris. No ulceration, no infection noted.  Return  Visit-Office Procedure: Patient instructed to return to the office for a follow up visit 3 months for continued evaluation and treatment.  Boneta Lucks, DPM    No follow-ups on file.

## 2021-11-15 DIAGNOSIS — Z23 Encounter for immunization: Secondary | ICD-10-CM | POA: Diagnosis not present

## 2021-11-21 DIAGNOSIS — Z6824 Body mass index (BMI) 24.0-24.9, adult: Secondary | ICD-10-CM | POA: Diagnosis not present

## 2021-11-21 DIAGNOSIS — Z111 Encounter for screening for respiratory tuberculosis: Secondary | ICD-10-CM | POA: Diagnosis not present

## 2021-11-21 DIAGNOSIS — F0390 Unspecified dementia without behavioral disturbance: Secondary | ICD-10-CM | POA: Diagnosis not present

## 2021-11-21 DIAGNOSIS — Z022 Encounter for examination for admission to residential institution: Secondary | ICD-10-CM | POA: Diagnosis not present

## 2022-01-02 DIAGNOSIS — R4689 Other symptoms and signs involving appearance and behavior: Secondary | ICD-10-CM | POA: Diagnosis not present

## 2022-01-02 DIAGNOSIS — F0280 Dementia in other diseases classified elsewhere without behavioral disturbance: Secondary | ICD-10-CM | POA: Diagnosis not present

## 2022-01-02 DIAGNOSIS — M159 Polyosteoarthritis, unspecified: Secondary | ICD-10-CM | POA: Diagnosis not present

## 2022-01-02 DIAGNOSIS — G309 Alzheimer's disease, unspecified: Secondary | ICD-10-CM | POA: Diagnosis not present

## 2022-01-02 DIAGNOSIS — E559 Vitamin D deficiency, unspecified: Secondary | ICD-10-CM | POA: Diagnosis not present

## 2022-01-03 DIAGNOSIS — F59 Unspecified behavioral syndromes associated with physiological disturbances and physical factors: Secondary | ICD-10-CM | POA: Diagnosis not present

## 2022-01-03 DIAGNOSIS — F03918 Unspecified dementia, unspecified severity, with other behavioral disturbance: Secondary | ICD-10-CM | POA: Diagnosis not present

## 2022-01-03 DIAGNOSIS — I10 Essential (primary) hypertension: Secondary | ICD-10-CM | POA: Diagnosis not present

## 2022-01-05 DIAGNOSIS — R269 Unspecified abnormalities of gait and mobility: Secondary | ICD-10-CM | POA: Diagnosis not present

## 2022-01-05 DIAGNOSIS — F03918 Unspecified dementia, unspecified severity, with other behavioral disturbance: Secondary | ICD-10-CM | POA: Diagnosis not present

## 2022-01-08 DIAGNOSIS — E119 Type 2 diabetes mellitus without complications: Secondary | ICD-10-CM | POA: Diagnosis not present

## 2022-01-08 DIAGNOSIS — E7849 Other hyperlipidemia: Secondary | ICD-10-CM | POA: Diagnosis not present

## 2022-01-08 DIAGNOSIS — N390 Urinary tract infection, site not specified: Secondary | ICD-10-CM | POA: Diagnosis not present

## 2022-01-08 DIAGNOSIS — E038 Other specified hypothyroidism: Secondary | ICD-10-CM | POA: Diagnosis not present

## 2022-01-08 DIAGNOSIS — Z79899 Other long term (current) drug therapy: Secondary | ICD-10-CM | POA: Diagnosis not present

## 2022-01-08 DIAGNOSIS — D518 Other vitamin B12 deficiency anemias: Secondary | ICD-10-CM | POA: Diagnosis not present

## 2022-01-08 DIAGNOSIS — E559 Vitamin D deficiency, unspecified: Secondary | ICD-10-CM | POA: Diagnosis not present

## 2022-01-09 DIAGNOSIS — R269 Unspecified abnormalities of gait and mobility: Secondary | ICD-10-CM | POA: Diagnosis not present

## 2022-01-09 DIAGNOSIS — E038 Other specified hypothyroidism: Secondary | ICD-10-CM | POA: Diagnosis not present

## 2022-01-09 DIAGNOSIS — F03918 Unspecified dementia, unspecified severity, with other behavioral disturbance: Secondary | ICD-10-CM | POA: Diagnosis not present

## 2022-01-09 DIAGNOSIS — D518 Other vitamin B12 deficiency anemias: Secondary | ICD-10-CM | POA: Diagnosis not present

## 2022-01-09 DIAGNOSIS — E559 Vitamin D deficiency, unspecified: Secondary | ICD-10-CM | POA: Diagnosis not present

## 2022-01-09 DIAGNOSIS — E119 Type 2 diabetes mellitus without complications: Secondary | ICD-10-CM | POA: Diagnosis not present

## 2022-01-09 DIAGNOSIS — M159 Polyosteoarthritis, unspecified: Secondary | ICD-10-CM | POA: Diagnosis not present

## 2022-01-09 DIAGNOSIS — F59 Unspecified behavioral syndromes associated with physiological disturbances and physical factors: Secondary | ICD-10-CM | POA: Diagnosis not present

## 2022-01-09 DIAGNOSIS — G309 Alzheimer's disease, unspecified: Secondary | ICD-10-CM | POA: Diagnosis not present

## 2022-01-16 DIAGNOSIS — R5381 Other malaise: Secondary | ICD-10-CM | POA: Diagnosis not present

## 2022-01-16 DIAGNOSIS — F03918 Unspecified dementia, unspecified severity, with other behavioral disturbance: Secondary | ICD-10-CM | POA: Diagnosis not present

## 2022-01-16 DIAGNOSIS — M159 Polyosteoarthritis, unspecified: Secondary | ICD-10-CM | POA: Diagnosis not present

## 2022-01-16 DIAGNOSIS — G309 Alzheimer's disease, unspecified: Secondary | ICD-10-CM | POA: Diagnosis not present

## 2022-01-16 DIAGNOSIS — E559 Vitamin D deficiency, unspecified: Secondary | ICD-10-CM | POA: Diagnosis not present

## 2022-01-16 DIAGNOSIS — R269 Unspecified abnormalities of gait and mobility: Secondary | ICD-10-CM | POA: Diagnosis not present

## 2022-01-17 DIAGNOSIS — F03918 Unspecified dementia, unspecified severity, with other behavioral disturbance: Secondary | ICD-10-CM | POA: Diagnosis not present

## 2022-01-17 DIAGNOSIS — F59 Unspecified behavioral syndromes associated with physiological disturbances and physical factors: Secondary | ICD-10-CM | POA: Diagnosis not present

## 2022-01-19 DIAGNOSIS — B351 Tinea unguium: Secondary | ICD-10-CM | POA: Diagnosis not present

## 2022-01-19 DIAGNOSIS — F03918 Unspecified dementia, unspecified severity, with other behavioral disturbance: Secondary | ICD-10-CM | POA: Diagnosis not present

## 2022-01-19 DIAGNOSIS — M2042 Other hammer toe(s) (acquired), left foot: Secondary | ICD-10-CM | POA: Diagnosis not present

## 2022-01-19 DIAGNOSIS — M79671 Pain in right foot: Secondary | ICD-10-CM | POA: Diagnosis not present

## 2022-01-19 DIAGNOSIS — M79672 Pain in left foot: Secondary | ICD-10-CM | POA: Diagnosis not present

## 2022-01-19 DIAGNOSIS — M2010 Hallux valgus (acquired), unspecified foot: Secondary | ICD-10-CM | POA: Diagnosis not present

## 2022-01-19 DIAGNOSIS — L600 Ingrowing nail: Secondary | ICD-10-CM | POA: Diagnosis not present

## 2022-01-19 DIAGNOSIS — M2041 Other hammer toe(s) (acquired), right foot: Secondary | ICD-10-CM | POA: Diagnosis not present

## 2022-01-19 DIAGNOSIS — R269 Unspecified abnormalities of gait and mobility: Secondary | ICD-10-CM | POA: Diagnosis not present

## 2022-01-25 DIAGNOSIS — R269 Unspecified abnormalities of gait and mobility: Secondary | ICD-10-CM | POA: Diagnosis not present

## 2022-01-25 DIAGNOSIS — F03918 Unspecified dementia, unspecified severity, with other behavioral disturbance: Secondary | ICD-10-CM | POA: Diagnosis not present

## 2022-01-26 DIAGNOSIS — F03918 Unspecified dementia, unspecified severity, with other behavioral disturbance: Secondary | ICD-10-CM | POA: Diagnosis not present

## 2022-01-26 DIAGNOSIS — R269 Unspecified abnormalities of gait and mobility: Secondary | ICD-10-CM | POA: Diagnosis not present

## 2022-01-29 ENCOUNTER — Emergency Department: Payer: Medicare Other

## 2022-01-29 ENCOUNTER — Other Ambulatory Visit: Payer: Self-pay

## 2022-01-29 ENCOUNTER — Inpatient Hospital Stay
Admission: EM | Admit: 2022-01-29 | Discharge: 2022-01-31 | DRG: 689 | Disposition: A | Discharge status: Home Health | Payer: Medicare Other | Attending: Osteopathic Medicine | Admitting: Osteopathic Medicine

## 2022-01-29 ENCOUNTER — Encounter: Payer: Self-pay | Admitting: Emergency Medicine

## 2022-01-29 DIAGNOSIS — R54 Age-related physical debility: Secondary | ICD-10-CM | POA: Diagnosis present

## 2022-01-29 DIAGNOSIS — N390 Urinary tract infection, site not specified: Secondary | ICD-10-CM | POA: Diagnosis not present

## 2022-01-29 DIAGNOSIS — E876 Hypokalemia: Secondary | ICD-10-CM | POA: Diagnosis present

## 2022-01-29 DIAGNOSIS — Z79899 Other long term (current) drug therapy: Secondary | ICD-10-CM | POA: Diagnosis not present

## 2022-01-29 DIAGNOSIS — E860 Dehydration: Secondary | ICD-10-CM | POA: Diagnosis not present

## 2022-01-29 DIAGNOSIS — Z7189 Other specified counseling: Secondary | ICD-10-CM

## 2022-01-29 DIAGNOSIS — R404 Transient alteration of awareness: Secondary | ICD-10-CM

## 2022-01-29 DIAGNOSIS — R9431 Abnormal electrocardiogram [ECG] [EKG]: Secondary | ICD-10-CM | POA: Diagnosis not present

## 2022-01-29 DIAGNOSIS — R4182 Altered mental status, unspecified: Secondary | ICD-10-CM | POA: Diagnosis present

## 2022-01-29 DIAGNOSIS — G9341 Metabolic encephalopathy: Secondary | ICD-10-CM | POA: Diagnosis present

## 2022-01-29 DIAGNOSIS — F0280 Dementia in other diseases classified elsewhere without behavioral disturbance: Secondary | ICD-10-CM

## 2022-01-29 DIAGNOSIS — F0393 Unspecified dementia, unspecified severity, with mood disturbance: Secondary | ICD-10-CM | POA: Diagnosis present

## 2022-01-29 DIAGNOSIS — F39 Unspecified mood [affective] disorder: Secondary | ICD-10-CM | POA: Diagnosis not present

## 2022-01-29 DIAGNOSIS — R413 Other amnesia: Secondary | ICD-10-CM | POA: Diagnosis not present

## 2022-01-29 DIAGNOSIS — R410 Disorientation, unspecified: Secondary | ICD-10-CM | POA: Diagnosis not present

## 2022-01-29 DIAGNOSIS — G9340 Encephalopathy, unspecified: Secondary | ICD-10-CM | POA: Diagnosis not present

## 2022-01-29 DIAGNOSIS — I10 Essential (primary) hypertension: Secondary | ICD-10-CM | POA: Diagnosis present

## 2022-01-29 DIAGNOSIS — I499 Cardiac arrhythmia, unspecified: Secondary | ICD-10-CM | POA: Diagnosis not present

## 2022-01-29 DIAGNOSIS — F0390 Unspecified dementia without behavioral disturbance: Secondary | ICD-10-CM | POA: Diagnosis present

## 2022-01-29 DIAGNOSIS — Z66 Do not resuscitate: Secondary | ICD-10-CM | POA: Diagnosis not present

## 2022-01-29 DIAGNOSIS — R000 Tachycardia, unspecified: Secondary | ICD-10-CM | POA: Diagnosis not present

## 2022-01-29 DIAGNOSIS — N3000 Acute cystitis without hematuria: Secondary | ICD-10-CM | POA: Diagnosis not present

## 2022-01-29 HISTORY — DX: Unspecified dementia, unspecified severity, without behavioral disturbance, psychotic disturbance, mood disturbance, and anxiety: F03.90

## 2022-01-29 LAB — URINALYSIS, ROUTINE W REFLEX MICROSCOPIC
Bilirubin Urine: NEGATIVE
Glucose, UA: NEGATIVE mg/dL
Ketones, ur: NEGATIVE mg/dL
Nitrite: NEGATIVE
Protein, ur: NEGATIVE mg/dL
Specific Gravity, Urine: 1.005 (ref 1.005–1.030)
Squamous Epithelial / HPF: NONE SEEN (ref 0–5)
pH: 7 (ref 5.0–8.0)

## 2022-01-29 LAB — COMPREHENSIVE METABOLIC PANEL
ALT: 12 U/L (ref 0–44)
AST: 20 U/L (ref 15–41)
Albumin: 3.7 g/dL (ref 3.5–5.0)
Alkaline Phosphatase: 80 U/L (ref 38–126)
Anion gap: 8 (ref 5–15)
BUN: 19 mg/dL (ref 8–23)
CO2: 29 mmol/L (ref 22–32)
Calcium: 9.1 mg/dL (ref 8.9–10.3)
Chloride: 105 mmol/L (ref 98–111)
Creatinine, Ser: 0.68 mg/dL (ref 0.44–1.00)
GFR, Estimated: >60 mL/min (ref >60)
Glucose, Bld: 90 mg/dL (ref 70–99)
Potassium: 3.9 mmol/L (ref 3.5–5.1)
Sodium: 142 mmol/L (ref 135–145)
Total Bilirubin: 0.9 mg/dL (ref 0.3–1.2)
Total Protein: 7 g/dL (ref 6.5–8.1)

## 2022-01-29 LAB — CBC WITH DIFFERENTIAL/PLATELET
Abs Immature Granulocytes: 0.02 10*3/uL (ref 0.00–0.07)
Basophils Absolute: 0.1 10*3/uL (ref 0.0–0.1)
Basophils Relative: 1 %
Eosinophils Absolute: 0.1 10*3/uL (ref 0.0–0.5)
Eosinophils Relative: 1 %
HCT: 35.6 % — ABNORMAL LOW (ref 36.0–46.0)
Hemoglobin: 11.1 g/dL — ABNORMAL LOW (ref 12.0–15.0)
Immature Granulocytes: 0 %
Lymphocytes Relative: 45 %
Lymphs Abs: 3.2 10*3/uL (ref 0.7–4.0)
MCH: 30.8 pg (ref 26.0–34.0)
MCHC: 31.2 g/dL (ref 30.0–36.0)
MCV: 98.9 fL (ref 80.0–100.0)
Monocytes Absolute: 0.8 10*3/uL (ref 0.1–1.0)
Monocytes Relative: 11 %
Neutro Abs: 3.1 10*3/uL (ref 1.7–7.7)
Neutrophils Relative %: 42 %
Platelets: 253 10*3/uL (ref 150–400)
RBC: 3.6 MIL/uL — ABNORMAL LOW (ref 3.87–5.11)
RDW: 13.2 % (ref 11.5–15.5)
WBC: 7.2 10*3/uL (ref 4.0–10.5)
nRBC: 0 % (ref 0.0–0.2)

## 2022-01-29 LAB — VALPROIC ACID LEVEL: Valproic Acid Lvl: <10 ug/mL — ABNORMAL LOW (ref 50.0–100.0)

## 2022-01-29 LAB — TROPONIN I (HIGH SENSITIVITY)
Troponin I (High Sensitivity): 7 ng/L (ref <18)
Troponin I (High Sensitivity): 7 ng/L (ref <18)

## 2022-01-29 LAB — PROCALCITONIN: Procalcitonin: <0.1 ng/mL

## 2022-01-29 MED ORDER — SODIUM CHLORIDE 0.9 % IV SOLN
1.0000 g | INTRAVENOUS | Status: DC
  Administered 2022-01-30: 1 g via INTRAVENOUS
  Filled 2022-01-29 (×2): qty 10

## 2022-01-29 MED ORDER — SODIUM CHLORIDE 0.9 % IV SOLN
1.0000 g | Freq: Once | INTRAVENOUS | Status: AC
  Administered 2022-01-29: 1 g via INTRAVENOUS
  Filled 2022-01-29: qty 10

## 2022-01-29 MED ORDER — DOCUSATE SODIUM 100 MG PO CAPS: 100.0000 mg | ORAL_CAPSULE | Freq: Two times a day (BID) | ORAL | Status: DC | PRN

## 2022-01-29 MED ORDER — ACETAMINOPHEN 650 MG RE SUPP: 650.0000 mg | Freq: Four times a day (QID) | RECTAL | Status: DC | PRN

## 2022-01-29 MED ORDER — DIVALPROEX SODIUM 125 MG PO DR TAB
125.0000 mg | DELAYED_RELEASE_TABLET | Freq: Two times a day (BID) | ORAL | Status: DC
  Administered 2022-01-30: 125 mg via ORAL
  Filled 2022-01-29: qty 1

## 2022-01-29 MED ORDER — LACTATED RINGERS IV BOLUS
1000.0000 mL | Freq: Once | INTRAVENOUS | Status: AC
Start: 2022-01-29 — End: 2022-01-29
  Administered 2022-01-29: 1000 mL via INTRAVENOUS

## 2022-01-29 MED ORDER — POLYETHYLENE GLYCOL 3350 17 G PO PACK: 17.0000 g | PACK | Freq: Every day | ORAL | Status: DC | PRN

## 2022-01-29 MED ORDER — ONDANSETRON HCL 4 MG PO TABS: 4.0000 mg | ORAL_TABLET | Freq: Four times a day (QID) | ORAL | Status: DC | PRN

## 2022-01-29 MED ORDER — ONDANSETRON HCL 4 MG/2ML IJ SOLN: 4.0000 mg | Freq: Four times a day (QID) | INTRAMUSCULAR | Status: DC | PRN

## 2022-01-29 MED ORDER — ENOXAPARIN SODIUM 40 MG/0.4ML IJ SOSY
40.0000 mg | PREFILLED_SYRINGE | INTRAMUSCULAR | Status: DC
  Administered 2022-01-29 – 2022-01-30 (×2): 40 mg via SUBCUTANEOUS
  Filled 2022-01-29 (×2): qty 0.4

## 2022-01-29 MED ORDER — ACETAMINOPHEN 325 MG PO TABS: 650.0000 mg | ORAL_TABLET | Freq: Four times a day (QID) | ORAL | Status: DC | PRN

## 2022-01-29 MED ORDER — LORAZEPAM 1 MG PO TABS: 1.0000 mg | ORAL_TABLET | Freq: Four times a day (QID) | ORAL | Status: DC | PRN

## 2022-01-29 MED ORDER — DONEPEZIL HCL 5 MG PO TABS
5.0000 mg | ORAL_TABLET | Freq: Every day | ORAL | Status: DC
  Administered 2022-01-29 – 2022-01-30 (×2): 5 mg via ORAL
  Filled 2022-01-29 (×2): qty 1

## 2022-01-29 NOTE — Hospital Course (Addendum)
Ms. Brandi Chang is an 88 year old female with dementia, hypertension, who presents to the emergency department 01/29/22 from [facility] facility for chief concerns of altered mental status. Initial vitals in the emergency department showed temperature 98.5, respiration rate of 17, heart rate 65, blood pressure 170/75, SPO2 of 99% on room air. Serum sodium is 142, potassium 3.9, chloride 105, bicarb of 29, BUN of 19, serum creatinine of 0.68, nonfasting blood glucose of 90, GFR greater than 60. WBC 7.2, hemoglobin 11.1, platelets of 253. High sensitive troponin was 7x2. UA showed moderate leukocytes. ED treatment: Ceftriaxone 1 g IV, LR 1 L bolus. Pt admitted. Continued to improve to baseline and pt was transitioned to po amoxicillin and stable for discharge  ?

## 2022-01-29 NOTE — Assessment & Plan Note (Addendum)
Likely d/t UTI, which was treated ?

## 2022-01-29 NOTE — Assessment & Plan Note (Addendum)
Continued home donepezil 5 mg nightly ?

## 2022-01-29 NOTE — H&P (Signed)
?History and Physical  ? ?Brandi Chang DOB: 1930/05/14 DOA: 01/29/2022 ? ?PCP: Brandi Ravel, MD  ?Outpatient Specialists: Brandi Chang, urology ?Patient coming from: [facility] ? ?I have personally briefly reviewed patient's old medical records in [facility] EMR. ? ?Chief Concern: Altered mental status ? ?HPI: Brandi Chang is an 88 year old female with dementia, hypertension, who presents to the emergency department from [facility] facility for chief concerns of altered mental status. ? ?Initial vitals in the emergency department showed temperature 98.5, respiration rate of 17, heart rate 65, blood pressure 170/75, SPO2 of 99% on room air. ? ?Serum sodium is 142, potassium 3.9, chloride 105, bicarb of 29, BUN of 19, serum creatinine of 0.68, nonfasting blood glucose of 90, GFR greater than 60. ? ?WBC 7.2, hemoglobin 11.1, platelets of 253. ? ?High sensitive troponin was 7x2. UA showed moderate leukocytes. ? ?ED treatment: Ceftriaxone 1 g IV, LR 1 L bolus. ? ?At bedside patient is able to tell me her name.  She knows that her birthday is [birth year].  She was not able to tell me her age.  She was not able to tell me the current calendar year, the current month, the current location.  She was not able to tell me the current president of the [place]. ? ?She does not know why she is in the hospital.  She denies chest pain, shortness of breath, nausea, vomiting, fever, dysuria, hematuria, diarrhea.  When I evaluated her legs, and removed the blanket she states states that it is cold and she wants me to put the blankets back in place. ? ?I called and spoke with son, Brandi Chang and he reports that after breakfast, she was fine.he states that they were not able to wake her up from sleeping and she missed lunch and dinner.  Son was called at approximately 4 PM.  Son saw her at 5:30 p and agreed that she should come to the hospital for further evaluation.  He states that at  baseline, she is up and interactive and walking.  ? ?He does endorse that at baseline she would not know her age, her current location, or the current calendar year or the current president. ? ?Social history: She is from [facility] facility. ? ?Vaccination history: She is vaccinated for covid 19 and influenza.  ? ?ROS: ?Constitutional: no weight change, no fever ?ENT/Mouth: no sore throat, no rhinorrhea ?Eyes: no eye pain, no vision changes ?Cardiovascular: no chest pain, no dyspnea,  no edema, no palpitations ?Respiratory: no cough, no sputum, no wheezing ?Gastrointestinal: no nausea, no vomiting, no diarrhea, no constipation ?Genitourinary: no urinary incontinence, no dysuria, no hematuria ?Musculoskeletal: no arthralgias, no myalgias ?Skin: no skin lesions, no pruritus, ?Neuro: + weakness, no loss of consciousness, no syncope ?Psych: no anxiety, no depression, + decrease appetite ?Heme/Lymph: no bruising, no bleeding ? ?ED Course: Discussed with emergency medicine provider, patient requiring hospitalization for chief concerns of altered mental status presumed secondary to UTI. ? ?Assessment/Plan ? ?Principal Problem: ?  Altered mental status ?Active Problems: ?  Dementia (HCC) ?  Encephalopathy acute ?  Loss of memory ?  Mood disorder (HCC) ?  DNR (do not resuscitate) discussion ?  ?Assessment and Plan: ? ?* Altered mental status ?- Etiology work-up in progress ?- Differentials include metabolic encephalopathy/altered mental status secondary to UTI versus pneumonia ?- UA was positive for moderate leukocytes and portable chest x-ray showed possible early pneumonia ?- Check procalcitonin, urine culture ordered ?- Incentive  spirometry, flutter valve ?- Status post ceftriaxone 1 g IV per EDP, LR 1 L bolus. ?- Continue with ceftriaxone 1 g daily, if procalcitonin is elevated we will add azithromycin for atypical coverage ?- Holding home Robaxin 500 mg every 6 hours as needed for muscle spasms at this time given  presentation of altered mental status and confusion, a.m. team to reevaluate patient and resume as appropriate ?- Admit to MedSurg, observation, no telemetry ?- Fall precautions, aspiration precautions ? ?DNR (do not resuscitate) discussion ?- Discussed with both Brandi Chang (and Brandi Chang daughter in law) and Brandi Chang who are her sons, her legal next of kin. They acknowledge and endorse that she only has 2 sons. They confirmed that their father, Brandi Chang has passed. They both agree separately that patient should be DNR.  They will work with outpatient provider and or legal counsel to ensure that patient will have a DNR status for now 1 ? ?Mood disorder (HCC) ?- Presumed Depakote 125 mg p.o. twice daily ? ?Loss of memory ?- Resumed home donepezil 5 mg nightly ? ?Chart reviewed.  ? ?DVT prophylaxis: enoxaparin ?Code Status: DNR ?Diet: heart healthy ?Family Communication: Discussed with both sons and her daughter-in-law over the phone ?Disposition Plan: Pending clinical course ?Consults called: None at this time ?Admission status: Med-Surg, observation ? ?Past Medical History:  ?Diagnosis Date  ? Arthritis   ? Dementia (HCC)   ? ?Past Surgical History:  ?Procedure Laterality Date  ? ORIF ANKLE FRACTURE Right 04/28/2017  ? Procedure: OPEN REDUCTION INTERNAL FIXATION (ORIF) ANKLE FRACTURE;  Surgeon: Brandi Fairly, MD;  Location: [facility] ORS;  Service: Orthopedics;  Laterality: Right;  ? ?Social History:  reports that she has never smoked. She has never used smokeless tobacco. She reports that she does not drink alcohol and does not use drugs. ? ?No Known Allergies ?History reviewed. No pertinent family history. ? ?Family history: Family history reviewed and not pertinent ? ?Prior to Admission medications   ?Medication Sig Start Date End Date Taking? Authorizing Provider  ?divalproex (DEPAKOTE) 125 MG DR tablet Take 125 mg by mouth 2 (two) times daily.    [provider]  ?docusate sodium  (COLACE) 100 MG capsule Take 1 capsule (100 mg total) by mouth 2 (two) times daily. 04/30/17   Katha Hamming, MD  ?donepezil (ARICEPT) 5 MG tablet Take 5 mg by mouth at bedtime.    [provider]  ?enoxaparin (LOVENOX) 40 MG/0.4ML injection Inject 0.4 mLs (40 mg total) into the skin daily. 05/01/17   Milagros Loll, MD  ?LORazepam (ATIVAN) 1 MG tablet Take 1 tablet (1 mg total) by mouth every 6 (six) hours as needed for anxiety (agitation). 04/30/17   Katha Hamming, MD  ?meloxicam (MOBIC) 15 MG tablet Take 1 tablet by mouth daily. 01/23/17   [provider]  ?methocarbamol (ROBAXIN) 500 MG tablet Take 1 tablet (500 mg total) by mouth every 6 (six) hours as needed for muscle spasms. 04/30/17   Katha Hamming, MD  ?traZODone (DESYREL) 50 MG tablet Take 25 mg by mouth at bedtime. Take 25 mg each evening    [provider]  ? ?Physical Exam: ?Vitals:  ? 01/29/22 1918 01/29/22 2000 01/29/22 2121 01/29/22 2226  ?BP: (!) 154/87 (!) 175/86 (!) 148/73 (!) 163/59  ?Pulse: 70 77 76 68  ?Resp: (!) 21 (!) 22 20 18   ?Temp:   98.4 ?F (36.9 ?C) 98.1 ?F (36.7 ?C)  ?TempSrc:   Oral   ?SpO2: 100%  99% 96% 95%  ?Weight:      ?Height:      ? ?Constitutional: appears age-appropriate, frail, NAD, calm, comfortable ?Eyes: PERRL, lids and conjunctivae normal ?ENMT: Mucous membranes are moist. Posterior pharynx clear of any exudate or lesions. Age-appropriate dentition. Hearing appropriate ?Neck: normal, supple, no masses, no thyromegaly ?Respiratory: clear to auscultation bilaterally, no wheezing, no crackles. Normal respiratory effort. No accessory muscle use.  ?Cardiovascular: Regular rate and rhythm, no murmurs / rubs / gallops. No extremity edema. 2+ pedal pulses. No carotid bruits.  ?Abdomen: no tenderness, no masses palpated, no hepatosplenomegaly. Bowel sounds positive.  ?Musculoskeletal: no clubbing / cyanosis. No joint deformity upper and lower extremities. Good ROM, no contractures, no  atrophy. Normal muscle tone.  ?Skin: no rashes, lesions, ulcers. No induration ?Neurologic: Sensation intact. Strength 5/5 in all 4.  ?Psychiatric: Lacks judgment and insight. Alert and oriented x self and birthday.  She d

## 2022-01-29 NOTE — ED Triage Notes (Signed)
Pt ems from [facility] for increased AMS. Per ems, pt with hx of dementia has become more confused.  ?

## 2022-01-29 NOTE — ED Notes (Signed)
Patient incontinent of large amount of urine in brief. Pericare performed, dry brief and external female catheter applied. No distress noted. ?

## 2022-01-29 NOTE — ED Provider Notes (Signed)
? ?[facility] ?Provider Note ? ? ? Event Date/Time  ? First MD Initiated Contact with Patient 01/29/22 1708   ?  (approximate) ? ? ?History  ? ?Chief Complaint ?Altered Mental Status ? ?HPI ? ?Brandi Chang is a 88 y.o. female with past medical history of dementia and arthritis who presents to the ED for altered mental status.  History is limited due to patient's baseline dementia and altered mental status, majority of history is obtained from EMS.  They state that they received a call from patient's assisted living facility that she was not as responsive as usual and had not gotten out of bed all day.  Staff reported that patient seemed well when she went to bed last night, but has not been talking with him today and her urine output seem decreased.  She is apparently confused at baseline but conversant and able to interact without difficulty.  Patient is slow to respond on arrival to the ED, but does eventually answer questions with one-word answers.  She denies any pain and is able to state her name, states that she is currently at home.  EMS was concerned that patient had heart rate elevated to greater than 200 and patient was given 2.5 mg of IV metoprolol. ?  ? ? ?Physical Exam  ? ?Triage Vital Signs: ?ED Triage Vitals  ?Enc Vitals Group  ?   BP   ?   Pulse   ?   Resp   ?   Temp   ?   Temp src   ?   SpO2   ?   Weight   ?   Height   ?   Head Circumference   ?   Peak Flow   ?   Pain Score   ?   Pain Loc   ?   Pain Edu?   ?   Excl. in GC?   ? ? ?Most recent vital signs: ?Vitals:  ? 01/29/22 1800 01/29/22 1918  ?BP: (!) 170/75 (!) 154/87  ?Pulse: 64 70  ?Resp: 17 (!) 21  ?Temp:    ?SpO2: 99% 100%  ? ? ?Constitutional: Somnolent but arousable to voice, oriented to person, but not place, time, or situation. ?Eyes: Conjunctivae are normal. ?Head: Atraumatic. ?Nose: No congestion/rhinnorhea. ?Mouth/Throat: Mucous membranes are moist.  ?Cardiovascular: Normal rate, regular rhythm. Grossly  normal heart sounds.  2+ radial pulses bilaterally. ?Respiratory: Normal respiratory effort.  No retractions. Lungs CTAB. ?Gastrointestinal: Soft and nontender. No distention. ?Musculoskeletal: No lower extremity tenderness nor edema.  ?Neurologic: Slow to respond with one-word answers. No gross focal neurologic deficits are appreciated. ? ? ? ?ED Results / Procedures / Treatments  ? ?Labs ?(all labs ordered are listed, but only abnormal results are displayed) ?Labs Reviewed  ?CBC WITH DIFFERENTIAL/PLATELET - Abnormal; Notable for the following components:  ?    Result Value  ? RBC 3.60 (*)   ? Hemoglobin 11.1 (*)   ? HCT 35.6 (*)   ? All other components within normal limits  ?URINALYSIS, ROUTINE W REFLEX MICROSCOPIC - Abnormal; Notable for the following components:  ? Color, Urine STRAW (*)   ? APPearance CLEAR (*)   ? Hgb urine dipstick SMALL (*)   ? Leukocytes,Ua MODERATE (*)   ? Bacteria, UA RARE (*)   ? All other components within normal limits  ?VALPROIC ACID LEVEL - Abnormal; Notable for the following components:  ? Valproic Acid Lvl <10 (*)   ? All other components within  normal limits  ?URINE CULTURE  ?COMPREHENSIVE METABOLIC PANEL  ?TROPONIN I (HIGH SENSITIVITY)  ?TROPONIN I (HIGH SENSITIVITY)  ? ? ? ?EKG ? ?ED ECG REPORT ?Harriet Masson, the attending physician, personally viewed and interpreted this ECG. ? ? Date: 01/29/2022 ? EKG Time: 17:12 ? Rate: 74 ? Rhythm: normal sinus rhythm ? Axis: Normal ? Intervals:none ? ST&T Change: None ? ?RADIOLOGY ?CT head reviewed by me with no hemorrhage or midline shift. ? ?PROCEDURES: ? ?Critical Care performed: No ? ?Procedures ? ? ?MEDICATIONS ORDERED IN ED: ?Medications  ?cefTRIAXone (ROCEPHIN) 1 g in sodium chloride 0.9 % 100 mL IVPB (has no administration in time range)  ?lactated ringers bolus 1,000 mL (1,000 mLs Intravenous New Bag/Given 01/29/22 1921)  ? ? ? ?IMPRESSION / MDM / ASSESSMENT AND PLAN / ED COURSE  ?I reviewed the triage vital signs and the  nursing notes. ?             ?               ? ?88 y.o. female with past medical history of dementia and arthritis who presents to the ED for altered mental status noted throughout the day today along with concern for elevated heart rate. ? ?Differential diagnosis includes, but is not limited to, intracranial process, dehydration, electrolyte abnormality, AKI, UTI, medication effect, ACS, arrhythmia, worsening dementia. ? ?Patient nontoxic-appearing and in no acute distress, heart rate here is noted to be within normal limits and rhythm strip provided by EMS appears that heart rate was artificially elevated due to patient's tremor.  EKG here in the ED shows normal sinus rhythm with no ischemic changes and we will continue to observe on cardiac monitor.  Patient has a nonfocal neurologic exam but does seem disoriented compared to her reported baseline.  We will check CT head, labs, UA and chest x-ray. ? ?CT head is negative for acute process, labs are unremarkable with CBC showing no leukocytosis, BMP without AKI or electrolyte abnormality.  No signs of arrhythmia noted on cardiac monitor and 2 sets of troponin are negative.  UA does appear concerning for UTI, which is likely the source of patient's altered mental status.  Urine was sent for culture, vital signs remain reassuring with no signs of sepsis.  We will treat with IV Rocephin and case discussed with hospitalist for admission. ? ?  ? ? ?FINAL CLINICAL IMPRESSION(S) / ED DIAGNOSES  ? ?Final diagnoses:  ?Altered mental status, unspecified altered mental status type  ?Urinary tract infection without hematuria, site unspecified  ? ? ? ?Rx / DC Orders  ? ?ED Discharge Orders   ? ? None  ? ?  ? ? ? ?Note:  This document was prepared using Dragon voice recognition software and may include unintentional dictation errors. ?  ?Chesley Noon, MD ?01/29/22 2006 ? ?

## 2022-01-29 NOTE — Assessment & Plan Note (Addendum)
Continued Depakote 125 mg p.o. twice daily ?

## 2022-01-30 ENCOUNTER — Encounter: Payer: Self-pay | Admitting: Internal Medicine

## 2022-01-30 DIAGNOSIS — Z7189 Other specified counseling: Secondary | ICD-10-CM | POA: Diagnosis not present

## 2022-01-30 DIAGNOSIS — R4182 Altered mental status, unspecified: Secondary | ICD-10-CM | POA: Diagnosis present

## 2022-01-30 DIAGNOSIS — G9341 Metabolic encephalopathy: Secondary | ICD-10-CM | POA: Diagnosis present

## 2022-01-30 DIAGNOSIS — N390 Urinary tract infection, site not specified: Secondary | ICD-10-CM | POA: Diagnosis present

## 2022-01-30 DIAGNOSIS — R404 Transient alteration of awareness: Secondary | ICD-10-CM | POA: Diagnosis not present

## 2022-01-30 DIAGNOSIS — E876 Hypokalemia: Secondary | ICD-10-CM | POA: Diagnosis present

## 2022-01-30 DIAGNOSIS — F0280 Dementia in other diseases classified elsewhere without behavioral disturbance: Secondary | ICD-10-CM | POA: Diagnosis not present

## 2022-01-30 DIAGNOSIS — I10 Essential (primary) hypertension: Secondary | ICD-10-CM | POA: Diagnosis present

## 2022-01-30 DIAGNOSIS — R54 Age-related physical debility: Secondary | ICD-10-CM | POA: Diagnosis present

## 2022-01-30 DIAGNOSIS — Z79899 Other long term (current) drug therapy: Secondary | ICD-10-CM | POA: Diagnosis not present

## 2022-01-30 DIAGNOSIS — R413 Other amnesia: Secondary | ICD-10-CM | POA: Diagnosis present

## 2022-01-30 DIAGNOSIS — Z66 Do not resuscitate: Secondary | ICD-10-CM | POA: Diagnosis not present

## 2022-01-30 DIAGNOSIS — G9340 Encephalopathy, unspecified: Secondary | ICD-10-CM | POA: Diagnosis not present

## 2022-01-30 DIAGNOSIS — F0393 Unspecified dementia, unspecified severity, with mood disturbance: Secondary | ICD-10-CM | POA: Diagnosis present

## 2022-01-30 DIAGNOSIS — N3000 Acute cystitis without hematuria: Secondary | ICD-10-CM | POA: Diagnosis not present

## 2022-01-30 DIAGNOSIS — F39 Unspecified mood [affective] disorder: Secondary | ICD-10-CM | POA: Diagnosis not present

## 2022-01-30 LAB — CBC
HCT: 32.2 % — ABNORMAL LOW (ref 36.0–46.0)
Hemoglobin: 10.6 g/dL — ABNORMAL LOW (ref 12.0–15.0)
MCH: 31.7 pg (ref 26.0–34.0)
MCHC: 32.9 g/dL (ref 30.0–36.0)
MCV: 96.4 fL (ref 80.0–100.0)
Platelets: 248 10*3/uL (ref 150–400)
RBC: 3.34 MIL/uL — ABNORMAL LOW (ref 3.87–5.11)
RDW: 13.1 % (ref 11.5–15.5)
WBC: 7.5 10*3/uL (ref 4.0–10.5)
nRBC: 0 % (ref 0.0–0.2)

## 2022-01-30 LAB — BASIC METABOLIC PANEL
Anion gap: 6 (ref 5–15)
BUN: 15 mg/dL (ref 8–23)
CO2: 29 mmol/L (ref 22–32)
Calcium: 8.7 mg/dL — ABNORMAL LOW (ref 8.9–10.3)
Chloride: 104 mmol/L (ref 98–111)
Creatinine, Ser: 0.75 mg/dL (ref 0.44–1.00)
GFR, Estimated: >60 mL/min (ref >60)
Glucose, Bld: 93 mg/dL (ref 70–99)
Potassium: 3.4 mmol/L — ABNORMAL LOW (ref 3.5–5.1)
Sodium: 139 mmol/L (ref 135–145)

## 2022-01-30 MED ORDER — HYDRALAZINE HCL 25 MG PO TABS: 25.0000 mg | ORAL_TABLET | Freq: Three times a day (TID) | ORAL | Status: DC | PRN

## 2022-01-30 MED ORDER — RISPERIDONE 0.5 MG PO TABS
0.5000 mg | ORAL_TABLET | Freq: Two times a day (BID) | ORAL | Status: DC
Start: 2022-01-30 — End: 2022-01-31
  Administered 2022-01-30 – 2022-01-31 (×2): 0.5 mg via ORAL
  Filled 2022-01-30 (×2): qty 1

## 2022-01-30 MED ORDER — MEMANTINE HCL 5 MG PO TABS
10.0000 mg | ORAL_TABLET | Freq: Two times a day (BID) | ORAL | Status: DC
  Administered 2022-01-30 – 2022-01-31 (×3): 10 mg via ORAL
  Filled 2022-01-30 (×3): qty 2

## 2022-01-30 MED ORDER — POTASSIUM CHLORIDE 20 MEQ PO PACK
40.0000 meq | PACK | Freq: Once | ORAL | Status: AC
  Administered 2022-01-30: 40 meq via ORAL
  Filled 2022-01-30: qty 2

## 2022-01-30 MED ORDER — TRAZODONE HCL 50 MG PO TABS: 25.0000 mg | ORAL_TABLET | Freq: Every day | ORAL | Status: DC

## 2022-01-30 MED ORDER — TRAZODONE HCL 50 MG PO TABS: 25.0000 mg | ORAL_TABLET | Freq: Every evening | ORAL | Status: DC | PRN

## 2022-01-30 NOTE — TOC Progression Note (Addendum)
Transition of Care (TOC) - Progression Note  ? ? ?Patient Details  ?Name: Brandi Chang ?MRN: [id number] ?Date of Birth: [birth year] ? ?Transition of Care (TOC) CM/SW Contact  ?Marlowe Sax, RN ?Phone Number: ?01/30/2022, 9:22 AM ? ?Clinical Narrative:   The patient lives at [facility], at baseline, she is up and interactive and walking.  ?  ?The patient is open with [facility] for PT and OT ? ? ?  ?  ? ?Expected Discharge Plan and Services ?  ?  ?  ?  ?  ?                ?  ?  ?  ?  ?  ?  ?  ?  ?  ?  ? ? ?Social Determinants of Health (SDOH) Interventions ?  ? ?Readmission Risk Interventions ?   ? View : No data to display.  ?  ?  ?  ? ? ?

## 2022-01-30 NOTE — Progress Notes (Signed)
?PROGRESS NOTE ? ? ? ?Brandi Chang  OBS:[id number] DOB: 1930/08/30 DOA: 01/29/2022 ?PCP: Ailene Ravel, MD  ? ? ?Brief Narrative:  ? ?Brandi Chang is a 88 year old female with past medical history significant for dementia, essential hypertension, mood disorder who presented to [facility] ED on 4/24 from SNF with confusion.  Given her baseline dementia, patient unable to participate fully in HPI.  Apparently per nursing staff from her living facility states that her baseline confusion worse than typical. ? ?In the ED, temperature 98.5 ?F, HR 64, RR 17, BP 170/75, SPO2 99% on room air.  WBC 7.2, hemoglobin 11.1, platelets 253.  Sodium 142, potassium 3.9, chloride 105, CO2 29, glucose 90, BUN 19, creatinine 0.68.  High sensitive troponin 7.  Procalcitonin less than 0.10.  Urinalysis with moderate leukocytes, negative nitrite, 21-50 WBCs.  CT head without contrast with no acute intracranial findings; noted atrophy with small vessel disease.  Chest x-ray with increased interstitial markings lower lung field suggestive of crowded bronchovascular structures due to poor inspiration without focal pulmonary consolidation, minimal left pleural effusion.  Patient was started on IV antibiotics.  Hospital service consulted for further evaluation and management of encephalopathy likely secondary to urinary tract infection. ? ?Assessment & Plan: ? ?Acute metabolic encephalopathy, POA ?Urinary tract infection ?Patient presenting to ED with worsening confusion than her typical baseline.  Patient is afebrile without leukocytosis.  CT head without contrast with no acute findings, noted atrophy and chronic small vessel ischemic disease.  Urinalysis suggestive of a UTI. ?--Urine culture: Pending ?--Ceftriaxone 1 g IV every 24 hours ? ?Hypokalemia ?Potassium 3.4, will replete.  Repeat electrolytes include magnesium in a.m. ? ?Essential hypertension ?Currently not on antihypertensive therapy outpatient. ?--Hydralazine 25 mg p.o. every  8 hours as needed SBP >165 or DBP >110 ?--Continue monitor BP closely ? ?Dementia ?Mood disorder ?--Namenda 10 mg p.o. twice daily ?--Donepezil 5 mg p.o. nightly ?--Risperidone 0.5 mg p.o. twice daily ?--Delirium precautions ?--Get up during the day ?--Encourage a familiar face to remain present throughout the day ?--Keep blinds open and lights on during daylight hours ?--Minimize the use of opioids/benzodiazepines ? ? ? ? ? ? ?DVT prophylaxis: enoxaparin (LOVENOX) injection 40 mg Start: 01/29/22 2200 ?Place TED hose Start: 01/29/22 2021 ? ?  Code Status: DNR ?Family Communication: Updated patient's son via telephone this morning ? ?Disposition Plan:  ?Level of care: Med-Surg ?Status is: Observation ?The patient remains OBS appropriate and will d/c before 2 midnights. ?  ? ?Consultants:  ?None ? ?Procedures:  ?None ? ?Antimicrobials:  ?Ceftriaxone 4/24>> ? ? ?Subjective: ?Patient seen examined bedside, resting comfortably.  No family present.  Pleasantly confused.  No specific complaints this morning.  Denies headache, no chest pain, no shortness of breath.  No acute events overnight per nursing staff. ? ?Objective: ?Vitals:  ? 01/29/22 2000 01/29/22 2121 01/29/22 2226 01/30/22 0840  ?BP: (!) 175/86 (!) 148/73 (!) 163/59 (!) 140/47  ?Pulse: 77 76 68 61  ?Resp: (!) 22 20 18 18   ?Temp:  98.4 ?F (36.9 ?C) 98.1 ?F (36.7 ?C) 98.1 ?F (36.7 ?C)  ?TempSrc:  Oral    ?SpO2: 99% 96% 95% 97%  ?Weight:      ?Height:      ? ? ?Intake/Output Summary (Last 24 hours) at 01/30/2022 1121 ?Last data filed at 01/30/2022 02/01/2022 ?Gross per 24 hour  ?Intake 1100 ml  ?Output 1000 ml  ?Net 100 ml  ? ?Filed Weights  ? 01/29/22 1735  ?Weight: 59 kg  ? ? ?  Examination: ? ?Physical Exam: ?GEN: NAD, alert, not oriented to person/place/time or situation, pleasantly confused ?HEENT: NCAT, PERRL, EOMI, sclera clear, MMM ?PULM: CTAB w/o wheezes/crackles, normal respiratory effort, on room air ?CV: RRR w/ 3/6 SEM, no gallops/rubs ?GI: abd soft, NTND, NABS,  no R/G/M ?MSK: no peripheral edema, muscle strength globally intact 5/5 bilateral upper/lower extremities ?NEURO: CN II-XII intact, no focal deficits, sensation to light touch intact ?PSYCH: normal mood/affect ?Integumentary: dry/intact, no rashes or wounds ? ? ? ?Data Reviewed: I have personally reviewed following labs and imaging studies ? ?CBC: ?Recent Labs  ?Lab 01/29/22 ?1713 01/30/22 ?0249  ?WBC 7.2 7.5  ?NEUTROABS 3.1  --   ?HGB 11.1* 10.6*  ?HCT 35.6* 32.2*  ?MCV 98.9 96.4  ?PLT 253 248  ? ?Basic Metabolic Panel: ?Recent Labs  ?Lab 01/29/22 ?1713 01/30/22 ?0249  ?NA 142 139  ?K 3.9 3.4*  ?CL 105 104  ?CO2 29 29  ?GLUCOSE 90 93  ?BUN 19 15  ?CREATININE 0.68 0.75  ?CALCIUM 9.1 8.7*  ? ?GFR: ?Estimated Creatinine Clearance: 42.9 mL/min (by C-G formula based on SCr of 0.75 mg/dL). ?Liver Function Tests: ?Recent Labs  ?Lab 01/29/22 ?1713  ?AST 20  ?ALT 12  ?ALKPHOS 80  ?BILITOT 0.9  ?PROT 7.0  ?ALBUMIN 3.7  ? ?No results for input(s): LIPASE, AMYLASE in the last 168 hours. ?No results for input(s): AMMONIA in the last 168 hours. ?Coagulation Profile: ?No results for input(s): INR, PROTIME in the last 168 hours. ?Cardiac Enzymes: ?No results for input(s): CKTOTAL, CKMB, CKMBINDEX, TROPONINI in the last 168 hours. ?BNP (last 3 results) ?No results for input(s): PROBNP in the last 8760 hours. ?HbA1C: ?No results for input(s): HGBA1C in the last 72 hours. ?CBG: ?No results for input(s): GLUCAP in the last 168 hours. ?Lipid Profile: ?No results for input(s): CHOL, HDL, LDLCALC, TRIG, CHOLHDL, LDLDIRECT in the last 72 hours. ?Thyroid Function Tests: ?No results for input(s): TSH, T4TOTAL, FREET4, T3FREE, THYROIDAB in the last 72 hours. ?Anemia Panel: ?No results for input(s): VITAMINB12, FOLATE, FERRITIN, TIBC, IRON, RETICCTPCT in the last 72 hours. ?Sepsis Labs: ?Recent Labs  ?Lab 01/29/22 ?1922  ?PROCALCITON <0.10  ? ? ?No results found for this or any previous visit (from the past 240 hour(s)).  ? ? ? ? ? ?Radiology  Studies: ?CT Head Wo Contrast ? ?Result Date: 01/29/2022 ?CLINICAL DATA:  Altered mental status EXAM: CT HEAD WITHOUT CONTRAST TECHNIQUE: Contiguous axial images were obtained from the base of the skull through the vertex without intravenous contrast. RADIATION DOSE REDUCTION: This exam was performed according to the departmental dose-optimization program which includes automated exposure control, adjustment of the mA and/or kV according to patient size and/or use of iterative reconstruction technique. COMPARISON:  None. FINDINGS: Brain: No acute intracranial findings are seen. There is mild prominence of third and both lateral ventricles. There is no shift of midline structures. There are no signs of bleeding within the cranium. Cortical sulci are prominent. There is decreased density in the periventricular and subcortical white matter. Vascular: Unremarkable. Skull: Unremarkable. Sinuses/Orbits: Unremarkable. Other: None IMPRESSION: No acute intracranial findings are seen. Atrophy. Small-vessel disease. Electronically Signed   By: Ernie AvenaPalani  Rathinasamy M.D.   On: 01/29/2022 17:33  ? ?DG Chest Portable 1 View ? ?Result Date: 01/29/2022 ?CLINICAL DATA:  Altered mental status EXAM: PORTABLE CHEST 1 VIEW COMPARISON:  04/28/2017 FINDINGS: Transverse diameter of heart is slightly increased. There are no signs of alveolar pulmonary edema. Increased interstitial markings are seen in the lower lung fields.  There is poor inspiration. There is no focal pulmonary consolidation. Left lateral CP angle is indistinct. There is no pneumothorax. IMPRESSION: Increased interstitial markings in the lower lung fields may suggest crowding of bronchovascular structures due to poor inspiration or early interstitial pneumonia. There is no focal pulmonary consolidation. Possible minimal left pleural effusion. Electronically Signed   By: Ernie Avena M.D.   On: 01/29/2022 17:31   ? ? ? ? ? ?Scheduled Meds: ? donepezil  5 mg Oral QHS  ?  enoxaparin (LOVENOX) injection  40 mg Subcutaneous Q24H  ? memantine  10 mg Oral BID  ? risperiDONE  0.5 mg Oral BID  ? ?Continuous Infusions: ? cefTRIAXone (ROCEPHIN)  IV    ? ? ? LOS: 0 days  ? ? ?T

## 2022-01-30 NOTE — Evaluation (Signed)
Physical Therapy Evaluation ?Patient Details ?Name: Brandi Chang ?MRN: [id number] ?DOB: 15-Nov-1929 ?Today's Date: 01/30/2022 ? ?History of Present Illness ? 88 year old female with dementia, hypertension, who presents to the emergency department from [facility] facility for chief concerns of altered mental status and lethargy.  ?Clinical Impression ? Pt pleasantly confused t/o session, needed some extra cuing and encouragement due to mental status but ultimately did well and was able to circumambulate the nurses' station with relative ease and w/o fatigue or safety issues.  Unsure of how her performance today compares to her baseline but needing no physical assist to get to sitting, standing or to walk with walker and given her level of confusion it is difficult to see how PT would effect much change in function.  Pt will benefit from continuing to stay active while here at the hospital, but does not require further skilled PT for this. ?   ? ?Recommendations for follow up therapy are one component of a multi-disciplinary discharge planning process, led by the attending physician.  Recommendations may be updated based on patient status, additional functional criteria and insurance authorization. ? ?Follow Up Recommendations No PT follow up ? ?  ?Assistance Recommended at Discharge Set up Supervision/Assistance  ?Patient can return home with the following ?   ? ?  ?Equipment Recommendations None recommended by PT  ?Recommendations for Other Services ?    ?  ?Functional Status Assessment Patient has not had a recent decline in their functional status  ? ?  ?Precautions / Restrictions Precautions ?Precautions: Fall ?Restrictions ?Weight Bearing Restrictions: No  ? ?  ? ?Mobility ? Bed Mobility ?Overal bed mobility: Modified Independent ?  ?  ?  ?  ?  ?  ?General bed mobility comments: did not need physical assist, however confusion necessitated extra cuing to insure she did, indeed, continue to transition all the way  to sitting EOB ?  ? ?Transfers ?Overall transfer level: Modified independent ?Equipment used: Rolling walker (2 wheels) ?  ?  ?  ?  ?  ?  ?  ?General transfer comment: again needing some extra cuing due to mental status, but able to easily rise, maintain balance with the walker and remain safe in standing ?  ? ?Ambulation/Gait ?Ambulation/Gait assistance: Supervision ?Gait Distance (Feet): 200 Feet ?Assistive device: Rolling walker (2 wheels) ?  ?  ?  ?  ?General Gait Details: Pt with consistent and relatively confident gain needing cuing only for directions and to insure she does not go too fast. ? ?Stairs ?  ?  ?  ?  ?  ? ?Wheelchair Mobility ?  ? ?Modified Rankin (Stroke Patients Only) ?  ? ?  ? ?Balance Overall balance assessment: Modified Independent ?  ?  ?  ?  ?  ?  ?  ?  ?  ?  ?  ?  ?  ?  ?  ?  ?  ?  ?   ? ? ? ?Pertinent Vitals/Pain Pain Assessment ?Pain Assessment: No/denies pain  ? ? ?Home Living Family/patient expects to be discharged to:: Assisted living ?  ?  ?  ?  ?  ?  ?  ?  ?Home Equipment: Conservation officer, nature (2 wheels) (pt struggled to give a lot of home info, but did indicate that she uses a walker normally) ?   ?  ?Prior Function Prior Level of Function : Patient poor historian/Family not available ?  ?  ?  ?  ?  ?  ?Mobility Comments: pt  moves in bed with relative ease once cued, appears she is likely up and walking regularly ?  ?  ? ? ?Hand Dominance  ?   ? ?  ?Extremity/Trunk Assessment  ? Upper Extremity Assessment ?Upper Extremity Assessment: Overall WFL for tasks assessed;Generalized weakness ?  ? ?Lower Extremity Assessment ?Lower Extremity Assessment: Overall WFL for tasks assessed;Generalized weakness ?  ? ?   ?Communication  ? Communication: No difficulties  ?Cognition Arousal/Alertness: Awake/alert ?Behavior During Therapy: [facility] for tasks assessed/performed ?Overall Cognitive Status: Within Functional Limits for tasks assessed ?  ?  ?  ?  ?  ?  ?  ?  ?  ?  ?  ?  ?  ?  ?  ?  ?  ?  ?  ? ?   ?General Comments General comments (skin integrity, edema, etc.): Pt was able to tolerate prolonged bout of ambulation with stable vitals, no c/o fatigue, no LOBs. ? ?  ?Exercises    ? ?Assessment/Plan  ?  ?PT Assessment Patient does not need any further PT services  ?PT Problem List   ? ?   ?  ?PT Treatment Interventions     ? ?PT Goals (Current goals can be found in the Care Plan section)  ?Acute Rehab PT Goals ?Patient Stated Goal: go home ?PT Goal Formulation: All assessment and education complete, DC therapy ? ?  ?Frequency   ?  ? ? ?Co-evaluation   ?  ?  ?  ?  ? ? ?  ?AM-PAC PT "6 Clicks" Mobility  ?Outcome Measure Help needed turning from your back to your side while in a flat bed without using bedrails?: None ?Help needed moving from lying on your back to sitting on the side of a flat bed without using bedrails?: None ?Help needed moving to and from a bed to a chair (including a wheelchair)?: None ?Help needed standing up from a chair using your arms (e.g., wheelchair or bedside chair)?: None ?Help needed to walk in hospital room?: None ?Help needed climbing 3-5 steps with a railing? : None ?6 Click Score: 24 ? ?  ?End of Session Equipment Utilized During Treatment: Gait belt ?Activity Tolerance: Patient tolerated treatment well ?Patient left: with chair alarm set;with call bell/phone within reach ?Nurse Communication: Mobility status ?PT Visit Diagnosis: Muscle weakness (generalized) (M62.81) ?  ? ?Time: [id number] ?PT Time Calculation (min) (ACUTE ONLY): 23 min ? ? ?Charges:   PT Evaluation ?$PT Eval Low Complexity: 1 Low ?  ?  ?   ? ? ?Kreg Shropshire, DPT ?01/30/2022, 12:54 PM ? ?

## 2022-01-30 NOTE — Plan of Care (Signed)

## 2022-01-30 NOTE — Assessment & Plan Note (Addendum)
Admitting physician d/w with both Mr. Brandi Chang (and Mr. Brandi Chang daughter in law) and Brandi Chang who are her sons, her legal next of kin. They acknowledge and endorse that she only has 2 sons. They confirmed that their father, [name] has passed. They both agree separately that patient should be DNR.  They will work with outpatient provider and or legal counsel to ensure that patient will have a DNR status  ?

## 2022-01-31 DIAGNOSIS — R404 Transient alteration of awareness: Secondary | ICD-10-CM | POA: Diagnosis not present

## 2022-01-31 DIAGNOSIS — R4182 Altered mental status, unspecified: Secondary | ICD-10-CM | POA: Diagnosis not present

## 2022-01-31 DIAGNOSIS — Z7189 Other specified counseling: Secondary | ICD-10-CM | POA: Diagnosis not present

## 2022-01-31 DIAGNOSIS — N3000 Acute cystitis without hematuria: Secondary | ICD-10-CM

## 2022-01-31 DIAGNOSIS — F0280 Dementia in other diseases classified elsewhere without behavioral disturbance: Secondary | ICD-10-CM | POA: Diagnosis not present

## 2022-01-31 LAB — BASIC METABOLIC PANEL
Anion gap: 8 (ref 5–15)
BUN: 16 mg/dL (ref 8–23)
CO2: 28 mmol/L (ref 22–32)
Calcium: 9.2 mg/dL (ref 8.9–10.3)
Chloride: 103 mmol/L (ref 98–111)
Creatinine, Ser: 0.75 mg/dL (ref 0.44–1.00)
GFR, Estimated: >60 mL/min (ref >60)
Glucose, Bld: 99 mg/dL (ref 70–99)
Potassium: 4 mmol/L (ref 3.5–5.1)
Sodium: 139 mmol/L (ref 135–145)

## 2022-01-31 LAB — URINE CULTURE: Culture: >=100000 — AB

## 2022-01-31 LAB — MAGNESIUM: Magnesium: 2.3 mg/dL (ref 1.7–2.4)

## 2022-01-31 MED ORDER — AMOXICILLIN 500 MG PO CAPS: 500.0000 mg | ORAL_CAPSULE | Freq: Three times a day (TID) | ORAL | 0 refills | Status: AC

## 2022-01-31 MED ORDER — AMOXICILLIN 500 MG PO CAPS: 500.0000 mg | ORAL_CAPSULE | Freq: Three times a day (TID) | ORAL | Status: DC

## 2022-01-31 NOTE — Discharge Summary (Signed)
?Physician Discharge Summary ?  ?Patient: Brandi Chang MRN: [id number] DOB: [birth year]  ?Admit date:     01/29/2022  ?Discharge date: 01/31/22  ?Discharge Physician: Emeterio Reeve  ? ?PCP: Hamrick, Lorin Mercy, MD  ? ?Recommendations at discharge:  ?Follow up with PCP in 1-2 weeks, PCP may consider recheck UA/UCx  ?Complete po antibiotics  ? ?Discharge Diagnoses: ?Principal Problem: ?  Altered mental status ?Active Problems: ?  Dementia ([place]) ?  Encephalopathy acute ?  Loss of memory ?  Mood disorder ([place]) ?  DNR (do not resuscitate) discussion ?  UTI (urinary tract infection) ?  AMS (altered mental status) ? ?Resolved Problems: ?  Essential hypertension ? ?Hospital Course: ?Ms. Brandi Chang is an 88 year old female with dementia, hypertension, who presents to the emergency department 01/29/22 from [facility] facility for chief concerns of altered mental status. Initial vitals in the emergency department showed temperature 98.5, respiration rate of 17, heart rate 65, blood pressure 170/75, SPO2 of 99% on room air. Serum sodium is 142, potassium 3.9, chloride 105, bicarb of 29, BUN of 19, serum creatinine of 0.68, nonfasting blood glucose of 90, GFR greater than 60. WBC 7.2, hemoglobin 11.1, platelets of 253. High sensitive troponin was 7x2. UA showed moderate leukocytes. ED treatment: Ceftriaxone 1 g IV, LR 1 L bolus. Pt admitted. Continued to improve to baseline and pt was transitioned to po amoxicillin and stable for discharge  ? ? ?Assessment and Plan: ?* Altered mental status ?Likely d/t UTI, which was treated ? ?DNR (do not resuscitate) discussion ?Admitting physician d/w with both Mr. Zania Wigent (and Mr. Syla Kiniry daughter in law) and Annisha Muhlbach who are her sons, her legal next of kin. They acknowledge and endorse that she only has 2 sons. They confirmed that their father, Mr. Steven senior has passed. They both agree separately that patient should be DNR.  They will work with outpatient provider and or  legal counsel to ensure that patient will have a DNR status  ? ?Mood disorder ([place]) ?Continued Depakote 125 mg p.o. twice daily ? ?Loss of memory ?Continued home donepezil 5 mg nightly ? ? ? ? ?  ? ? ?Consultants: none ?Procedures performed: none  ?Disposition: Home health ?Diet recommendation:  ?Cardiac and Carb modified diet ?DISCHARGE MEDICATION: ?Allergies as of 01/31/2022   ?No Known Allergies ?  ? ?  ?Medication List  ?  ? ?STOP taking these medications   ? ?divalproex 125 MG DR tablet ?Commonly known as: DEPAKOTE ?  ?docusate sodium 100 MG capsule ?Commonly known as: COLACE ?  ?enoxaparin 40 MG/0.4ML injection ?Commonly known as: LOVENOX ?  ?methocarbamol 500 MG tablet ?Commonly known as: ROBAXIN ?  ?traZODone 50 MG tablet ?Commonly known as: DESYREL ?  ? ?  ? ?TAKE these medications   ? ?amoxicillin 500 MG capsule ?Commonly known as: AMOXIL ?Take 1 capsule (500 mg total) by mouth every 8 (eight) hours for 7 doses. ?  ?cholecalciferol 25 MCG (1000 UNIT) tablet ?Commonly known as: VITAMIN D3 ?Take 2,000 Units by mouth daily. ?  ?donepezil 5 MG tablet ?Commonly known as: ARICEPT ?Take 5 mg by mouth at bedtime. ?  ?LORazepam 1 MG tablet ?Commonly known as: ATIVAN ?Take 1 tablet (1 mg total) by mouth every 6 (six) hours as needed for anxiety (agitation). ?  ?meloxicam 15 MG tablet ?Commonly known as: MOBIC ?Take 1 tablet by mouth daily. ?  ?memantine 10 MG tablet ?Commonly known as: NAMENDA ?Take 10 mg by mouth 2 (two) times daily. ?  ?  risperiDONE 0.5 MG tablet ?Commonly known as: RISPERDAL ?Take 0.5 mg by mouth 2 (two) times daily. ?  ? ?  ? ? ?Discharge Exam: ?Danley Danker Weights  ? 01/29/22 1735  ?Weight: 59 kg  ? ?Constitutional:  ?VSS, see nurse notes ?General Appearance: alert, well-developed, well-nourished, NAD ?Eyes: ?Normal lids and conjunctive, non-icteric sclera ?Ears, Nose, Mouth, Throat: ?Normal appearance ?MMM, posterior pharynx without erythema/exudate ?Neck: ?No masses, trachea  midline ?Respiratory: ?Normal respiratory effort ?Breath sounds normal, no wheeze/rhonchi/rales ?Cardiovascular: ?S1/S2 normal, no murmur/rub/gallop auscultated ?No lower extremity edema ?Gastrointestinal: ?Nontender, no masses ?No hernia appreciated ?Musculoskeletal:  ?No clubbing/cyanosis of digits ?Neurological: ?No cranial nerve deficit on limited exa ?Psychiatric: ?Normal judgment/insight ?Normal mood and affect ? ? ?Condition at discharge: fair ? ?The results of significant diagnostics from this hospitalization (including imaging, microbiology, ancillary and laboratory) are listed below for reference.  ? ?Imaging Studies: ?CT Head Wo Contrast ? ?Result Date: 01/29/2022 ?CLINICAL DATA:  Altered mental status EXAM: CT HEAD WITHOUT CONTRAST TECHNIQUE: Contiguous axial images were obtained from the base of the skull through the vertex without intravenous contrast. RADIATION DOSE REDUCTION: This exam was performed according to the departmental dose-optimization program which includes automated exposure control, adjustment of the mA and/or kV according to patient size and/or use of iterative reconstruction technique. COMPARISON:  None. FINDINGS: Brain: No acute intracranial findings are seen. There is mild prominence of third and both lateral ventricles. There is no shift of midline structures. There are no signs of bleeding within the cranium. Cortical sulci are prominent. There is decreased density in the periventricular and subcortical white matter. Vascular: Unremarkable. Skull: Unremarkable. Sinuses/Orbits: Unremarkable. Other: None IMPRESSION: No acute intracranial findings are seen. Atrophy. Small-vessel disease. Electronically Signed   By: Elmer Picker M.D.   On: 01/29/2022 17:33  ? ?DG Chest Portable 1 View ? ?Result Date: 01/29/2022 ?CLINICAL DATA:  Altered mental status EXAM: PORTABLE CHEST 1 VIEW COMPARISON:  04/28/2017 FINDINGS: Transverse diameter of heart is slightly increased. There are no signs  of alveolar pulmonary edema. Increased interstitial markings are seen in the lower lung fields. There is poor inspiration. There is no focal pulmonary consolidation. Left lateral CP angle is indistinct. There is no pneumothorax. IMPRESSION: Increased interstitial markings in the lower lung fields may suggest crowding of bronchovascular structures due to poor inspiration or early interstitial pneumonia. There is no focal pulmonary consolidation. Possible minimal left pleural effusion. Electronically Signed   By: Elmer Picker M.D.   On: 01/29/2022 17:31   ? ?Microbiology: ?Results for orders placed or performed during the hospital encounter of 01/29/22  ?Urine Culture     Status: Abnormal  ? Collection Time: 01/29/22  7:03 PM  ? Specimen: Urine, Random  ?Result Value Ref Range Status  ? Specimen Description   Final  ?  URINE, RANDOM ?Performed at [facility], [address] [place], [place] [address] ?  ? Special Requests   Final  ?  NONE ?Performed at [facility], [address], [place], [place] [address] ?  ? Culture (A)  Final  ?  >=100,000 COLONIES/mL GROUP B STREP(S.AGALACTIAE)ISOLATED ?TESTING AGAINST S. AGALACTIAE NOT ROUTINELY PERFORMED DUE TO PREDICTABILITY OF AMP/PEN/VAN SUSCEPTIBILITY. ?Performed at [facility], [place] [address], [place], [place] [address] ?  ? Report Status 01/31/2022 FINAL  Final  ? ? ?Labs: ?CBC: ?Recent Labs  ?Lab 01/29/22 ?1713 01/30/22 ?0249  ?WBC 7.2 7.5  ?NEUTROABS 3.1  --   ?HGB 11.1* 10.6*  ?HCT 35.6* 32.2*  ?  MCV 98.9 96.4  ?PLT 253 248  ? ?Basic Metabolic Panel: ?Recent Labs  ?Lab 01/29/22 ?1713 01/30/22 ?0249 01/31/22 ?0412  ?NA 142 139 139  ?K 3.9 3.4* 4.0  ?CL 105 104 103  ?CO2 29 29 28   ?GLUCOSE 90 93 99  ?BUN 19 15 16   ?CREATININE 0.68 0.75 0.75  ?CALCIUM 9.1 8.7* 9.2  ?MG  --   --  2.3  ? ?Liver Function Tests: ?Recent Labs  ?Lab 01/29/22 ?1713  ?AST 20  ?ALT 12  ?ALKPHOS 80  ?BILITOT 0.9  ?PROT 7.0  ?ALBUMIN 3.7  ? ?CBG: ?No results for  input(s): GLUCAP in the last 168 hours. ? ?Discharge time spent: greater than 30 minutes. ? ?Signed: ?Emeterio Reeve, DO ?[facility] ?01/31/2022 ?

## 2022-01-31 NOTE — Plan of Care (Signed)

## 2022-01-31 NOTE — TOC Progression Note (Addendum)
Transition of Care (TOC) - Progression Note  ? ? ?Patient Details  ?Name: Brandi Chang ?MRN: [id number] ?Date of Birth: 11-12-1929 ? ?Transition of Care (TOC) CM/SW Contact  ?Marlowe Sax, RN ?Phone Number: ?01/31/2022, 2:32 PM ? ?Clinical Narrative:    ? ?Spoke to [name] ?They will come pick her up ?I explained that Iantha Fallen will continue with HH ?I called [facility], Made them aware of the DC today, They use [facility] in [place], It is not in the system, Scripts will be printed for them to obtain ?Expected Discharge Plan: Home w Home Health Services ?Barriers to Discharge: Continued Medical Work up ? ?Expected Discharge Plan and Services ?Expected Discharge Plan: Home w Home Health Services ?  ?  ?  ?Living arrangements for the past 2 months: Assisted Living Facility ?                ?  ?  ?  ?  ?  ?  ?  ?  ?  ?  ? ? ?Social Determinants of Health (SDOH) Interventions ?  ? ?Readmission Risk Interventions ?   ? View : No data to display.  ?  ?  ?  ? ? ?

## 2022-02-01 DIAGNOSIS — F03918 Unspecified dementia, unspecified severity, with other behavioral disturbance: Secondary | ICD-10-CM | POA: Diagnosis not present

## 2022-02-01 DIAGNOSIS — R269 Unspecified abnormalities of gait and mobility: Secondary | ICD-10-CM | POA: Diagnosis not present

## 2022-02-02 DIAGNOSIS — R269 Unspecified abnormalities of gait and mobility: Secondary | ICD-10-CM | POA: Diagnosis not present

## 2022-02-02 DIAGNOSIS — F03918 Unspecified dementia, unspecified severity, with other behavioral disturbance: Secondary | ICD-10-CM | POA: Diagnosis not present

## 2022-02-03 DIAGNOSIS — I10 Essential (primary) hypertension: Secondary | ICD-10-CM | POA: Diagnosis not present

## 2022-02-04 DIAGNOSIS — R269 Unspecified abnormalities of gait and mobility: Secondary | ICD-10-CM | POA: Diagnosis not present

## 2022-02-04 DIAGNOSIS — F03918 Unspecified dementia, unspecified severity, with other behavioral disturbance: Secondary | ICD-10-CM | POA: Diagnosis not present

## 2022-02-05 DIAGNOSIS — F03918 Unspecified dementia, unspecified severity, with other behavioral disturbance: Secondary | ICD-10-CM | POA: Diagnosis not present

## 2022-02-05 DIAGNOSIS — R269 Unspecified abnormalities of gait and mobility: Secondary | ICD-10-CM | POA: Diagnosis not present

## 2022-02-06 DIAGNOSIS — M159 Polyosteoarthritis, unspecified: Secondary | ICD-10-CM | POA: Diagnosis not present

## 2022-02-06 DIAGNOSIS — R5381 Other malaise: Secondary | ICD-10-CM | POA: Diagnosis not present

## 2022-02-06 DIAGNOSIS — E559 Vitamin D deficiency, unspecified: Secondary | ICD-10-CM | POA: Diagnosis not present

## 2022-02-06 DIAGNOSIS — N390 Urinary tract infection, site not specified: Secondary | ICD-10-CM | POA: Diagnosis not present

## 2022-02-06 DIAGNOSIS — F0280 Dementia in other diseases classified elsewhere without behavioral disturbance: Secondary | ICD-10-CM | POA: Diagnosis not present

## 2022-02-06 DIAGNOSIS — G309 Alzheimer's disease, unspecified: Secondary | ICD-10-CM | POA: Diagnosis not present

## 2022-02-07 DIAGNOSIS — E559 Vitamin D deficiency, unspecified: Secondary | ICD-10-CM | POA: Diagnosis not present

## 2022-02-07 DIAGNOSIS — E038 Other specified hypothyroidism: Secondary | ICD-10-CM | POA: Diagnosis not present

## 2022-02-07 DIAGNOSIS — G309 Alzheimer's disease, unspecified: Secondary | ICD-10-CM | POA: Diagnosis not present

## 2022-02-07 DIAGNOSIS — M159 Polyosteoarthritis, unspecified: Secondary | ICD-10-CM | POA: Diagnosis not present

## 2022-02-07 DIAGNOSIS — D518 Other vitamin B12 deficiency anemias: Secondary | ICD-10-CM | POA: Diagnosis not present

## 2022-02-07 DIAGNOSIS — E119 Type 2 diabetes mellitus without complications: Secondary | ICD-10-CM | POA: Diagnosis not present

## 2022-02-08 DIAGNOSIS — F03918 Unspecified dementia, unspecified severity, with other behavioral disturbance: Secondary | ICD-10-CM | POA: Diagnosis not present

## 2022-02-08 DIAGNOSIS — R269 Unspecified abnormalities of gait and mobility: Secondary | ICD-10-CM | POA: Diagnosis not present

## 2022-02-12 DIAGNOSIS — E119 Type 2 diabetes mellitus without complications: Secondary | ICD-10-CM | POA: Diagnosis not present

## 2022-02-12 DIAGNOSIS — E7849 Other hyperlipidemia: Secondary | ICD-10-CM | POA: Diagnosis not present

## 2022-02-12 DIAGNOSIS — F03918 Unspecified dementia, unspecified severity, with other behavioral disturbance: Secondary | ICD-10-CM | POA: Diagnosis not present

## 2022-02-12 DIAGNOSIS — Z79899 Other long term (current) drug therapy: Secondary | ICD-10-CM | POA: Diagnosis not present

## 2022-02-12 DIAGNOSIS — D518 Other vitamin B12 deficiency anemias: Secondary | ICD-10-CM | POA: Diagnosis not present

## 2022-02-12 DIAGNOSIS — R269 Unspecified abnormalities of gait and mobility: Secondary | ICD-10-CM | POA: Diagnosis not present

## 2022-02-13 DIAGNOSIS — R5381 Other malaise: Secondary | ICD-10-CM | POA: Diagnosis not present

## 2022-02-13 DIAGNOSIS — F0280 Dementia in other diseases classified elsewhere without behavioral disturbance: Secondary | ICD-10-CM | POA: Diagnosis not present

## 2022-02-13 DIAGNOSIS — N390 Urinary tract infection, site not specified: Secondary | ICD-10-CM | POA: Diagnosis not present

## 2022-02-13 DIAGNOSIS — E559 Vitamin D deficiency, unspecified: Secondary | ICD-10-CM | POA: Diagnosis not present

## 2022-02-13 DIAGNOSIS — B351 Tinea unguium: Secondary | ICD-10-CM | POA: Diagnosis not present

## 2022-02-13 DIAGNOSIS — M159 Polyosteoarthritis, unspecified: Secondary | ICD-10-CM | POA: Diagnosis not present

## 2022-02-14 DIAGNOSIS — F59 Unspecified behavioral syndromes associated with physiological disturbances and physical factors: Secondary | ICD-10-CM | POA: Diagnosis not present

## 2022-02-14 DIAGNOSIS — F03918 Unspecified dementia, unspecified severity, with other behavioral disturbance: Secondary | ICD-10-CM | POA: Diagnosis not present

## 2022-02-15 DIAGNOSIS — F03918 Unspecified dementia, unspecified severity, with other behavioral disturbance: Secondary | ICD-10-CM | POA: Diagnosis not present

## 2022-02-15 DIAGNOSIS — R269 Unspecified abnormalities of gait and mobility: Secondary | ICD-10-CM | POA: Diagnosis not present

## 2022-02-20 DIAGNOSIS — R269 Unspecified abnormalities of gait and mobility: Secondary | ICD-10-CM | POA: Diagnosis not present

## 2022-02-20 DIAGNOSIS — N390 Urinary tract infection, site not specified: Secondary | ICD-10-CM | POA: Diagnosis not present

## 2022-02-20 DIAGNOSIS — F03918 Unspecified dementia, unspecified severity, with other behavioral disturbance: Secondary | ICD-10-CM | POA: Diagnosis not present

## 2022-02-23 DIAGNOSIS — F03918 Unspecified dementia, unspecified severity, with other behavioral disturbance: Secondary | ICD-10-CM | POA: Diagnosis not present

## 2022-02-23 DIAGNOSIS — R269 Unspecified abnormalities of gait and mobility: Secondary | ICD-10-CM | POA: Diagnosis not present

## 2022-02-26 DIAGNOSIS — F03918 Unspecified dementia, unspecified severity, with other behavioral disturbance: Secondary | ICD-10-CM | POA: Diagnosis not present

## 2022-02-26 DIAGNOSIS — R269 Unspecified abnormalities of gait and mobility: Secondary | ICD-10-CM | POA: Diagnosis not present

## 2022-03-01 DIAGNOSIS — F03918 Unspecified dementia, unspecified severity, with other behavioral disturbance: Secondary | ICD-10-CM | POA: Diagnosis not present

## 2022-03-01 DIAGNOSIS — R269 Unspecified abnormalities of gait and mobility: Secondary | ICD-10-CM | POA: Diagnosis not present

## 2022-03-05 DIAGNOSIS — I10 Essential (primary) hypertension: Secondary | ICD-10-CM | POA: Diagnosis not present

## 2022-03-06 DIAGNOSIS — E559 Vitamin D deficiency, unspecified: Secondary | ICD-10-CM | POA: Diagnosis not present

## 2022-03-06 DIAGNOSIS — F05 Delirium due to known physiological condition: Secondary | ICD-10-CM | POA: Diagnosis not present

## 2022-03-06 DIAGNOSIS — F02818 Dementia in other diseases classified elsewhere, unspecified severity, with other behavioral disturbance: Secondary | ICD-10-CM | POA: Diagnosis not present

## 2022-03-06 DIAGNOSIS — M159 Polyosteoarthritis, unspecified: Secondary | ICD-10-CM | POA: Diagnosis not present

## 2022-03-06 DIAGNOSIS — R4689 Other symptoms and signs involving appearance and behavior: Secondary | ICD-10-CM | POA: Diagnosis not present

## 2022-03-06 DIAGNOSIS — R269 Unspecified abnormalities of gait and mobility: Secondary | ICD-10-CM | POA: Diagnosis not present

## 2022-03-06 DIAGNOSIS — G309 Alzheimer's disease, unspecified: Secondary | ICD-10-CM | POA: Diagnosis not present

## 2022-03-06 DIAGNOSIS — R5381 Other malaise: Secondary | ICD-10-CM | POA: Diagnosis not present

## 2022-03-08 DIAGNOSIS — R269 Unspecified abnormalities of gait and mobility: Secondary | ICD-10-CM | POA: Diagnosis not present

## 2022-03-08 DIAGNOSIS — R5381 Other malaise: Secondary | ICD-10-CM | POA: Diagnosis not present

## 2022-03-08 DIAGNOSIS — G309 Alzheimer's disease, unspecified: Secondary | ICD-10-CM | POA: Diagnosis not present

## 2022-03-08 DIAGNOSIS — F05 Delirium due to known physiological condition: Secondary | ICD-10-CM | POA: Diagnosis not present

## 2022-03-08 DIAGNOSIS — R4689 Other symptoms and signs involving appearance and behavior: Secondary | ICD-10-CM | POA: Diagnosis not present

## 2022-03-08 DIAGNOSIS — F02818 Dementia in other diseases classified elsewhere, unspecified severity, with other behavioral disturbance: Secondary | ICD-10-CM | POA: Diagnosis not present

## 2022-03-09 DIAGNOSIS — G309 Alzheimer's disease, unspecified: Secondary | ICD-10-CM | POA: Diagnosis not present

## 2022-03-09 DIAGNOSIS — E038 Other specified hypothyroidism: Secondary | ICD-10-CM | POA: Diagnosis not present

## 2022-03-09 DIAGNOSIS — I10 Essential (primary) hypertension: Secondary | ICD-10-CM | POA: Diagnosis not present

## 2022-03-09 DIAGNOSIS — E559 Vitamin D deficiency, unspecified: Secondary | ICD-10-CM | POA: Diagnosis not present

## 2022-03-09 DIAGNOSIS — E119 Type 2 diabetes mellitus without complications: Secondary | ICD-10-CM | POA: Diagnosis not present

## 2022-03-09 DIAGNOSIS — D518 Other vitamin B12 deficiency anemias: Secondary | ICD-10-CM | POA: Diagnosis not present

## 2022-03-09 DIAGNOSIS — M159 Polyosteoarthritis, unspecified: Secondary | ICD-10-CM | POA: Diagnosis not present

## 2022-03-09 DIAGNOSIS — E782 Mixed hyperlipidemia: Secondary | ICD-10-CM | POA: Diagnosis not present

## 2022-03-13 DIAGNOSIS — R269 Unspecified abnormalities of gait and mobility: Secondary | ICD-10-CM | POA: Diagnosis not present

## 2022-03-13 DIAGNOSIS — R4689 Other symptoms and signs involving appearance and behavior: Secondary | ICD-10-CM | POA: Diagnosis not present

## 2022-03-13 DIAGNOSIS — F02818 Dementia in other diseases classified elsewhere, unspecified severity, with other behavioral disturbance: Secondary | ICD-10-CM | POA: Diagnosis not present

## 2022-03-13 DIAGNOSIS — R5381 Other malaise: Secondary | ICD-10-CM | POA: Diagnosis not present

## 2022-03-13 DIAGNOSIS — G309 Alzheimer's disease, unspecified: Secondary | ICD-10-CM | POA: Diagnosis not present

## 2022-03-13 DIAGNOSIS — F05 Delirium due to known physiological condition: Secondary | ICD-10-CM | POA: Diagnosis not present

## 2022-03-15 DIAGNOSIS — F03918 Unspecified dementia, unspecified severity, with other behavioral disturbance: Secondary | ICD-10-CM | POA: Diagnosis not present

## 2022-03-15 DIAGNOSIS — F59 Unspecified behavioral syndromes associated with physiological disturbances and physical factors: Secondary | ICD-10-CM | POA: Diagnosis not present

## 2022-03-20 DIAGNOSIS — B351 Tinea unguium: Secondary | ICD-10-CM | POA: Diagnosis not present

## 2022-03-20 DIAGNOSIS — R4689 Other symptoms and signs involving appearance and behavior: Secondary | ICD-10-CM | POA: Diagnosis not present

## 2022-03-20 DIAGNOSIS — R600 Localized edema: Secondary | ICD-10-CM | POA: Diagnosis not present

## 2022-03-20 DIAGNOSIS — G309 Alzheimer's disease, unspecified: Secondary | ICD-10-CM | POA: Diagnosis not present

## 2022-03-20 DIAGNOSIS — R269 Unspecified abnormalities of gait and mobility: Secondary | ICD-10-CM | POA: Diagnosis not present

## 2022-03-20 DIAGNOSIS — M159 Polyosteoarthritis, unspecified: Secondary | ICD-10-CM | POA: Diagnosis not present

## 2022-03-20 DIAGNOSIS — F05 Delirium due to known physiological condition: Secondary | ICD-10-CM | POA: Diagnosis not present

## 2022-03-20 DIAGNOSIS — R5381 Other malaise: Secondary | ICD-10-CM | POA: Diagnosis not present

## 2022-03-20 DIAGNOSIS — E559 Vitamin D deficiency, unspecified: Secondary | ICD-10-CM | POA: Diagnosis not present

## 2022-03-20 DIAGNOSIS — I10 Essential (primary) hypertension: Secondary | ICD-10-CM | POA: Diagnosis not present

## 2022-03-20 DIAGNOSIS — F02818 Dementia in other diseases classified elsewhere, unspecified severity, with other behavioral disturbance: Secondary | ICD-10-CM | POA: Diagnosis not present

## 2022-03-22 DIAGNOSIS — F59 Unspecified behavioral syndromes associated with physiological disturbances and physical factors: Secondary | ICD-10-CM | POA: Diagnosis not present

## 2022-03-22 DIAGNOSIS — N390 Urinary tract infection, site not specified: Secondary | ICD-10-CM | POA: Diagnosis not present

## 2022-03-22 DIAGNOSIS — F03918 Unspecified dementia, unspecified severity, with other behavioral disturbance: Secondary | ICD-10-CM | POA: Diagnosis not present

## 2022-03-23 DIAGNOSIS — R601 Generalized edema: Secondary | ICD-10-CM | POA: Diagnosis not present

## 2022-03-24 ENCOUNTER — Other Ambulatory Visit: Payer: Self-pay

## 2022-03-24 ENCOUNTER — Emergency Department
Admission: EM | Admit: 2022-03-24 | Discharge: 2022-03-24 | Disposition: A | Discharge status: Skilled Nursing Facility | Payer: Medicare Other | Attending: Emergency Medicine | Admitting: Emergency Medicine

## 2022-03-24 ENCOUNTER — Emergency Department: Payer: Medicare Other

## 2022-03-24 DIAGNOSIS — J984 Other disorders of lung: Secondary | ICD-10-CM | POA: Diagnosis not present

## 2022-03-24 DIAGNOSIS — N399 Disorder of urinary system, unspecified: Secondary | ICD-10-CM | POA: Diagnosis not present

## 2022-03-24 DIAGNOSIS — D72829 Elevated white blood cell count, unspecified: Secondary | ICD-10-CM | POA: Diagnosis not present

## 2022-03-24 DIAGNOSIS — F0390 Unspecified dementia without behavioral disturbance: Secondary | ICD-10-CM | POA: Insufficient documentation

## 2022-03-24 DIAGNOSIS — R5381 Other malaise: Secondary | ICD-10-CM | POA: Diagnosis not present

## 2022-03-24 DIAGNOSIS — S199XXA Unspecified injury of neck, initial encounter: Secondary | ICD-10-CM | POA: Diagnosis not present

## 2022-03-24 DIAGNOSIS — S3993XA Unspecified injury of pelvis, initial encounter: Secondary | ICD-10-CM | POA: Diagnosis not present

## 2022-03-24 DIAGNOSIS — I878 Other specified disorders of veins: Secondary | ICD-10-CM | POA: Diagnosis not present

## 2022-03-24 DIAGNOSIS — W19XXXA Unspecified fall, initial encounter: Secondary | ICD-10-CM | POA: Diagnosis not present

## 2022-03-24 DIAGNOSIS — M5031 Other cervical disc degeneration,  high cervical region: Secondary | ICD-10-CM | POA: Diagnosis not present

## 2022-03-24 DIAGNOSIS — I6529 Occlusion and stenosis of unspecified carotid artery: Secondary | ICD-10-CM | POA: Diagnosis not present

## 2022-03-24 DIAGNOSIS — N3000 Acute cystitis without hematuria: Secondary | ICD-10-CM | POA: Insufficient documentation

## 2022-03-24 DIAGNOSIS — R4182 Altered mental status, unspecified: Secondary | ICD-10-CM | POA: Diagnosis not present

## 2022-03-24 DIAGNOSIS — I10 Essential (primary) hypertension: Secondary | ICD-10-CM | POA: Diagnosis not present

## 2022-03-24 LAB — CBC WITH DIFFERENTIAL/PLATELET
Abs Immature Granulocytes: 0.04 10*3/uL (ref 0.00–0.07)
Basophils Absolute: 0.1 10*3/uL (ref 0.0–0.1)
Basophils Relative: 1 %
Eosinophils Absolute: 0 10*3/uL (ref 0.0–0.5)
Eosinophils Relative: 0 %
HCT: 35.7 % — ABNORMAL LOW (ref 36.0–46.0)
Hemoglobin: 11.4 g/dL — ABNORMAL LOW (ref 12.0–15.0)
Immature Granulocytes: 0 %
Lymphocytes Relative: 10 %
Lymphs Abs: 1.1 10*3/uL (ref 0.7–4.0)
MCH: 31.4 pg (ref 26.0–34.0)
MCHC: 31.9 g/dL (ref 30.0–36.0)
MCV: 98.3 fL (ref 80.0–100.0)
Monocytes Absolute: 1.2 10*3/uL — ABNORMAL HIGH (ref 0.1–1.0)
Monocytes Relative: 10 %
Neutro Abs: 9 10*3/uL — ABNORMAL HIGH (ref 1.7–7.7)
Neutrophils Relative %: 79 %
Platelets: 294 10*3/uL (ref 150–400)
RBC: 3.63 MIL/uL — ABNORMAL LOW (ref 3.87–5.11)
RDW: 12.9 % (ref 11.5–15.5)
WBC: 11.4 10*3/uL — ABNORMAL HIGH (ref 4.0–10.5)
nRBC: 0 % (ref 0.0–0.2)

## 2022-03-24 LAB — COMPREHENSIVE METABOLIC PANEL
ALT: 16 U/L (ref 0–44)
AST: 27 U/L (ref 15–41)
Albumin: 3.8 g/dL (ref 3.5–5.0)
Alkaline Phosphatase: 96 U/L (ref 38–126)
Anion gap: 9 (ref 5–15)
BUN: 23 mg/dL (ref 8–23)
CO2: 28 mmol/L (ref 22–32)
Calcium: 9.4 mg/dL (ref 8.9–10.3)
Chloride: 104 mmol/L (ref 98–111)
Creatinine, Ser: 0.68 mg/dL (ref 0.44–1.00)
GFR, Estimated: >60 mL/min (ref >60)
Glucose, Bld: 116 mg/dL — ABNORMAL HIGH (ref 70–99)
Potassium: 4.2 mmol/L (ref 3.5–5.1)
Sodium: 141 mmol/L (ref 135–145)
Total Bilirubin: 0.9 mg/dL (ref 0.3–1.2)
Total Protein: 7.7 g/dL (ref 6.5–8.1)

## 2022-03-24 LAB — URINALYSIS, ROUTINE W REFLEX MICROSCOPIC
Bilirubin Urine: NEGATIVE
Glucose, UA: NEGATIVE mg/dL
Ketones, ur: NEGATIVE mg/dL
Nitrite: POSITIVE — AB
Protein, ur: 100 mg/dL — AB
Specific Gravity, Urine: 1.013 (ref 1.005–1.030)
WBC, UA: >50 WBC/hpf — ABNORMAL HIGH (ref 0–5)
pH: 6 (ref 5.0–8.0)

## 2022-03-24 MED ORDER — CEPHALEXIN 500 MG PO CAPS
500.0000 mg | ORAL_CAPSULE | Freq: Four times a day (QID) | ORAL | 0 refills | Status: AC
Start: 2022-03-24 — End: 2022-03-29

## 2022-03-24 MED ORDER — CEFTRIAXONE SODIUM 1 G IJ SOLR
1.0000 g | Freq: Once | INTRAMUSCULAR | Status: AC
  Administered 2022-03-24: 1 g via INTRAVENOUS
  Filled 2022-03-24: qty 10

## 2022-03-24 NOTE — ED Notes (Signed)
Family called and will be enroute to pick patient up shortly

## 2022-03-24 NOTE — ED Notes (Signed)
Family provided dc ppw at time of DC. Pt family questions answered and follow up and RX info reviewed. Pt assisted to lobby via wheelchair at this time. Family provides verbal consent at this time aswell

## 2022-03-24 NOTE — Discharge Instructions (Signed)
Brandi Chang has no signs of injuries from any possible falls.  Our tests also show that she has a UTI.  She got a single dose of ceftriaxone/Rocephin in the IV while in the emergency room.  Please start the oral antibiotics tomorrow,  Sunday 6/18

## 2022-03-24 NOTE — ED Triage Notes (Signed)
Pt arrives via EMS from [facility] after being found in the closet sitting down- pt was tested for a UTI a couple days ago and has not been treated for it yet- pt is more altered than normal per staff at [facility]

## 2022-03-24 NOTE — ED Notes (Signed)
Attempted to call report to [facility] with no answer

## 2022-03-24 NOTE — ED Notes (Signed)
Pt taken for scans 

## 2022-03-24 NOTE — ED Provider Notes (Addendum)
[facility] Provider Note    Event Date/Time   First MD Initiated Contact with Patient 03/24/22 0848     (approximate)   History   Altered Mental Status   HPI  Brandi Chang is a 88 y.o. female who presents to the ED for evaluation of Altered Mental Status   I reviewed medical DC summary from 4/26 where she was admitted for acute metabolic encephalopathy due to UTI.  She is a history of dementia at baseline, HTN and resides at a local SNF.  DNR status..  Patient presents to the ED for evaluation of being found sitting on the floor in her closet after recently being diagnosed with a UTI.  She was able to be stood back up and walked back to her bed.  She presents with paperwork from a outside lab recommending she be started on cephalexin to treat a UTI.  Per reports and per the [facility] from the facility, patient has not been started on antibiotics and that the facility themselves just received this report today or yesterday.  Here in the ED, she is nonverbal.  Expresses no complaints and does not seem to be in any distress, but cannot provide any history.  Physical Exam   Triage Vital Signs: ED Triage Vitals [03/24/22 0837]  Enc Vitals Group     BP (!) 128/48     Pulse Rate 68     Resp 18     Temp 98.7 F (37.1 C)     Temp Source Axillary     SpO2 96 %     Weight 160 lb (72.6 kg)     Height 5\' 6"  (1.676 m)     Head Circumference      Peak Flow      Pain Score      Pain Loc      Pain Edu?      Excl. in GC?     Most recent vital signs: Vitals:   03/24/22 0900 03/24/22 1045  BP: (!) 113/47 134/78  Pulse: 71 77  Resp: 18 18  Temp:    SpO2: 95% 97%    General: Awake, no distress.  CV:  Good peripheral perfusion. RRR Resp:  Normal effort.  Abd:  No distention.  Grimaces with suprapubic palpation, not peritoneal, upper abdomen is benign. MSK:  No deformity noted.  Passively ranges all 4 extremities without grimacing, signs of pain or  decreased range of motion.  No signs of trauma to the extremities of the back. Neuro:  No focal deficits appreciated. Other:     ED Results / Procedures / Treatments   Labs (all labs ordered are listed, but only abnormal results are displayed) Labs Reviewed  COMPREHENSIVE METABOLIC PANEL - Abnormal; Notable for the following components:      Result Value   Glucose, Bld 116 (*)    All other components within normal limits  CBC WITH DIFFERENTIAL/PLATELET - Abnormal; Notable for the following components:   WBC 11.4 (*)    RBC 3.63 (*)    Hemoglobin 11.4 (*)    HCT 35.7 (*)    Neutro Abs 9.0 (*)    Monocytes Absolute 1.2 (*)    All other components within normal limits  URINALYSIS, ROUTINE W REFLEX MICROSCOPIC - Abnormal; Notable for the following components:   Color, Urine YELLOW (*)    APPearance TURBID (*)    Hgb urine dipstick SMALL (*)    Protein, ur 100 (*)  Nitrite POSITIVE (*)    Leukocytes,Ua LARGE (*)    WBC, UA >50 (*)    Bacteria, UA FEW (*)    All other components within normal limits  URINE CULTURE    EKG Sinus rhythm, rate of 71 bpm.  Normal axis and intervals.  No evidence of acute ischemia.  RADIOLOGY CXR interpreted by me without evidence of acute cardiopulmonary pathology. Plain film of the pelvis interpreted by me without evidence of fracture or dislocation CT head interpreted by me without evidence of acute intracranial pathology CT C-spine without evidence of fracture or dislocation  Official radiology report(s): DG Pelvis Portable  Result Date: 03/24/2022 CLINICAL DATA:  88 year old female found down. Altered mental status. EXAM: PORTABLE PELVIS 1-2 VIEWS COMPARISON:  CT Abdomen and Pelvis 07/13/2004. FINDINGS: Portable AP view at 0927 hours. Bone mineralization is within normal limits for age. Femoral heads normally located. Pelvis appears intact. SI joints appear symmetric and normal. Grossly intact proximal femurs. Chronic left hemipelvis  phleboliths. Negative visible other lower abdominal and pelvic visceral contours. IMPRESSION: No acute fracture or dislocation identified about the pelvis. If there is lateralizing hip pain recommend dedicated hip series. Electronically Signed   By: Genevie Ann M.D.   On: 03/24/2022 09:45   DG Chest Portable 1 View  Result Date: 03/24/2022 CLINICAL DATA:  88 year old female found down. Altered mental status. EXAM: PORTABLE CHEST 1 VIEW COMPARISON:  Portable chest 01/29/2022 and earlier. FINDINGS: Portable AP view at 0923 hours. Improved lung volumes compared to April. Normal cardiac size and mediastinal contours. Visualized tracheal air column is within normal limits. Allowing for portable technique the lungs are clear. No pneumothorax. No acute osseous abnormality identified. Paucity of bowel gas in the upper abdomen. IMPRESSION: No acute cardiopulmonary abnormality or acute traumatic injury identified. Electronically Signed   By: Genevie Ann M.D.   On: 03/24/2022 09:44   CT Cervical Spine Wo Contrast  Result Date: 03/24/2022 CLINICAL DATA:  88 year old female found down. Altered mental status. EXAM: CT CERVICAL SPINE WITHOUT CONTRAST TECHNIQUE: Multidetector CT imaging of the cervical spine was performed without intravenous contrast. Multiplanar CT image reconstructions were also generated. RADIATION DOSE REDUCTION: This exam was performed according to the departmental dose-optimization program which includes automated exposure control, adjustment of the mA and/or kV according to patient size and/or use of iterative reconstruction technique. COMPARISON:  Head CT today. FINDINGS: Alignment: Mild reversal of cervical lordosis. Bilateral posterior element alignment is within normal limits. Subtle degenerative appearing anterolisthesis of C7 on T1. Skull base and vertebrae: Visualized skull base is intact. No atlanto-occipital dissociation. Bone mineralization is within normal limits for age. C1 and C2 appear intact  and aligned. No acute osseous abnormality identified. Soft tissues and spinal canal: No prevertebral fluid or swelling. No visible canal hematoma. Calcified carotid atherosclerosis, otherwise negative visible noncontrast neck soft tissues. Disc levels: Chronic cervical spine degeneration superimposed on C2-C3 and probable developing C3-C4 posterior element ankylosis. However, no significant spinal stenosis suspected. Upper chest: Mild T1 compression fracture (sagittal image 35) age indeterminate but probably chronic. Other visible upper thoracic levels appear intact. Mild apical lung scarring. IMPRESSION: 1. No acute traumatic injury identified in the cervical spine. 2. Cervical spine degeneration but no significant spinal stenosis suspected. 3. A mild T1 compression fracture is age indeterminate but probably chronic. Electronically Signed   By: Genevie Ann M.D.   On: 03/24/2022 09:43   CT HEAD WO CONTRAST (5MM)  Result Date: 03/24/2022 CLINICAL DATA:  88 year old female found down. Altered  mental status. EXAM: CT HEAD WITHOUT CONTRAST TECHNIQUE: Contiguous axial images were obtained from the base of the skull through the vertex without intravenous contrast. RADIATION DOSE REDUCTION: This exam was performed according to the departmental dose-optimization program which includes automated exposure control, adjustment of the mA and/or kV according to patient size and/or use of iterative reconstruction technique. COMPARISON:  Brain MRI 01/29/2022. FINDINGS: Brain: Stable cerebral volume. Stable gray-white matter differentiation, Patchy and confluent bilateral white matter hypodensity. Chronic temporal lobe volume loss. No midline shift, ventriculomegaly, mass effect, evidence of mass lesion, intracranial hemorrhage or evidence of cortically based acute infarction. Vascular: Calcified atherosclerosis at the skull base. No suspicious intracranial vascular hyperdensity. Skull: No acute osseous abnormality identified.  Sinuses/Orbits: Visualized paranasal sinuses and mastoids are stable and well aerated. Other: No acute orbit or scalp soft tissue finding. IMPRESSION: No acute intracranial abnormality. No acute traumatic injury identified. Electronically Signed   By: Odessa Fleming M.D.   On: 03/24/2022 09:40    PROCEDURES and INTERVENTIONS:  .1-3 Lead EKG Interpretation  Performed by: Delton Prairie, MD Authorized by: Delton Prairie, MD     Interpretation: normal     ECG rate:  70   ECG rate assessment: normal     Rhythm: sinus rhythm     Ectopy: none     Conduction: normal     Medications  cefTRIAXone (ROCEPHIN) 1 g in sodium chloride 0.9 % 100 mL IVPB (0 g Intravenous Stopped 03/24/22 1038)     IMPRESSION / MDM / ASSESSMENT AND PLAN / ED COURSE  I reviewed the triage vital signs and the nursing notes.  Differential diagnosis includes, but is not limited to, stroke, ICH, metabolic encephalopathy, femur fracture, rib fracture  {Patient presents with symptoms of an acute illness or injury that is potentially life-threatening.  88 year old female with nonverbal dementia presents after being found on the floor in her closet, with evidence of an untreated acute cystitis but suitable for return to facility after the initiation of antibiotics.  No distress or external signs of trauma.  I can passively and fully range all 4 extremities without apparent discomfort, distress or signs of trauma.  No deficits apparent.  Imaging is reassuring, as above.  Blood work with normal metabolic panel CBC with marginal leukocytosis.  Cath urine with clear signs of infection.  We will send for culture and get her started on antibiotics.  Ceftriaxone here in the ED and discharged with Keflex.  I considered observation admission for this patient, but she can return to her facility to continue this course.      FINAL CLINICAL IMPRESSION(S) / ED DIAGNOSES   Final diagnoses:  Acute cystitis without hematuria  Dementia, unspecified  dementia severity, unspecified dementia type, unspecified whether behavioral, psychotic, or mood disturbance or anxiety (HCC)     Rx / DC Orders   ED Discharge Orders          Ordered    cephALEXin (KEFLEX) 500 MG capsule  4 times daily        03/24/22 1012             Note:  This document was prepared using Dragon voice recognition software and may include unintentional dictation errors.   Delton Prairie, MD 03/24/22 1126    Delton Prairie, MD 03/24/22 1126

## 2022-03-27 DIAGNOSIS — R5381 Other malaise: Secondary | ICD-10-CM | POA: Diagnosis not present

## 2022-03-27 DIAGNOSIS — S22010D Wedge compression fracture of first thoracic vertebra, subsequent encounter for fracture with routine healing: Secondary | ICD-10-CM | POA: Diagnosis not present

## 2022-03-27 DIAGNOSIS — M159 Polyosteoarthritis, unspecified: Secondary | ICD-10-CM | POA: Diagnosis not present

## 2022-03-27 DIAGNOSIS — I10 Essential (primary) hypertension: Secondary | ICD-10-CM | POA: Diagnosis not present

## 2022-03-27 DIAGNOSIS — R269 Unspecified abnormalities of gait and mobility: Secondary | ICD-10-CM | POA: Diagnosis not present

## 2022-03-27 DIAGNOSIS — N390 Urinary tract infection, site not specified: Secondary | ICD-10-CM | POA: Diagnosis not present

## 2022-03-27 DIAGNOSIS — R4689 Other symptoms and signs involving appearance and behavior: Secondary | ICD-10-CM | POA: Diagnosis not present

## 2022-03-27 DIAGNOSIS — F05 Delirium due to known physiological condition: Secondary | ICD-10-CM | POA: Diagnosis not present

## 2022-03-27 DIAGNOSIS — E559 Vitamin D deficiency, unspecified: Secondary | ICD-10-CM | POA: Diagnosis not present

## 2022-03-27 DIAGNOSIS — F02818 Dementia in other diseases classified elsewhere, unspecified severity, with other behavioral disturbance: Secondary | ICD-10-CM | POA: Diagnosis not present

## 2022-03-27 DIAGNOSIS — B351 Tinea unguium: Secondary | ICD-10-CM | POA: Diagnosis not present

## 2022-03-27 DIAGNOSIS — G309 Alzheimer's disease, unspecified: Secondary | ICD-10-CM | POA: Diagnosis not present

## 2022-03-27 LAB — URINE CULTURE: Culture: >=100000 — AB

## 2022-04-03 DIAGNOSIS — Z79899 Other long term (current) drug therapy: Secondary | ICD-10-CM | POA: Diagnosis not present

## 2022-04-03 DIAGNOSIS — D518 Other vitamin B12 deficiency anemias: Secondary | ICD-10-CM | POA: Diagnosis not present

## 2022-04-03 DIAGNOSIS — E782 Mixed hyperlipidemia: Secondary | ICD-10-CM | POA: Diagnosis not present

## 2022-04-03 DIAGNOSIS — E119 Type 2 diabetes mellitus without complications: Secondary | ICD-10-CM | POA: Diagnosis not present

## 2022-04-04 DIAGNOSIS — R5381 Other malaise: Secondary | ICD-10-CM | POA: Diagnosis not present

## 2022-04-04 DIAGNOSIS — R269 Unspecified abnormalities of gait and mobility: Secondary | ICD-10-CM | POA: Diagnosis not present

## 2022-04-04 DIAGNOSIS — F02818 Dementia in other diseases classified elsewhere, unspecified severity, with other behavioral disturbance: Secondary | ICD-10-CM | POA: Diagnosis not present

## 2022-04-04 DIAGNOSIS — F05 Delirium due to known physiological condition: Secondary | ICD-10-CM | POA: Diagnosis not present

## 2022-04-04 DIAGNOSIS — G309 Alzheimer's disease, unspecified: Secondary | ICD-10-CM | POA: Diagnosis not present

## 2022-04-04 DIAGNOSIS — R4689 Other symptoms and signs involving appearance and behavior: Secondary | ICD-10-CM | POA: Diagnosis not present

## 2022-04-05 DIAGNOSIS — R4689 Other symptoms and signs involving appearance and behavior: Secondary | ICD-10-CM | POA: Diagnosis not present

## 2022-04-05 DIAGNOSIS — E559 Vitamin D deficiency, unspecified: Secondary | ICD-10-CM | POA: Diagnosis not present

## 2022-04-05 DIAGNOSIS — F05 Delirium due to known physiological condition: Secondary | ICD-10-CM | POA: Diagnosis not present

## 2022-04-05 DIAGNOSIS — F02818 Dementia in other diseases classified elsewhere, unspecified severity, with other behavioral disturbance: Secondary | ICD-10-CM | POA: Diagnosis not present

## 2022-04-05 DIAGNOSIS — R269 Unspecified abnormalities of gait and mobility: Secondary | ICD-10-CM | POA: Diagnosis not present

## 2022-04-05 DIAGNOSIS — I10 Essential (primary) hypertension: Secondary | ICD-10-CM | POA: Diagnosis not present

## 2022-04-05 DIAGNOSIS — G309 Alzheimer's disease, unspecified: Secondary | ICD-10-CM | POA: Diagnosis not present

## 2022-04-05 DIAGNOSIS — R5381 Other malaise: Secondary | ICD-10-CM | POA: Diagnosis not present

## 2022-04-05 DIAGNOSIS — M159 Polyosteoarthritis, unspecified: Secondary | ICD-10-CM | POA: Diagnosis not present

## 2022-04-09 DIAGNOSIS — G309 Alzheimer's disease, unspecified: Secondary | ICD-10-CM | POA: Diagnosis not present

## 2022-04-09 DIAGNOSIS — E119 Type 2 diabetes mellitus without complications: Secondary | ICD-10-CM | POA: Diagnosis not present

## 2022-04-09 DIAGNOSIS — E559 Vitamin D deficiency, unspecified: Secondary | ICD-10-CM | POA: Diagnosis not present

## 2022-04-09 DIAGNOSIS — D518 Other vitamin B12 deficiency anemias: Secondary | ICD-10-CM | POA: Diagnosis not present

## 2022-04-09 DIAGNOSIS — E782 Mixed hyperlipidemia: Secondary | ICD-10-CM | POA: Diagnosis not present

## 2022-04-09 DIAGNOSIS — E038 Other specified hypothyroidism: Secondary | ICD-10-CM | POA: Diagnosis not present

## 2022-04-09 DIAGNOSIS — I10 Essential (primary) hypertension: Secondary | ICD-10-CM | POA: Diagnosis not present

## 2022-04-09 DIAGNOSIS — M159 Polyosteoarthritis, unspecified: Secondary | ICD-10-CM | POA: Diagnosis not present

## 2022-04-11 DIAGNOSIS — R269 Unspecified abnormalities of gait and mobility: Secondary | ICD-10-CM | POA: Diagnosis not present

## 2022-04-11 DIAGNOSIS — R4689 Other symptoms and signs involving appearance and behavior: Secondary | ICD-10-CM | POA: Diagnosis not present

## 2022-04-11 DIAGNOSIS — G309 Alzheimer's disease, unspecified: Secondary | ICD-10-CM | POA: Diagnosis not present

## 2022-04-11 DIAGNOSIS — R5381 Other malaise: Secondary | ICD-10-CM | POA: Diagnosis not present

## 2022-04-11 DIAGNOSIS — F02818 Dementia in other diseases classified elsewhere, unspecified severity, with other behavioral disturbance: Secondary | ICD-10-CM | POA: Diagnosis not present

## 2022-04-11 DIAGNOSIS — F05 Delirium due to known physiological condition: Secondary | ICD-10-CM | POA: Diagnosis not present

## 2022-04-17 DIAGNOSIS — M159 Polyosteoarthritis, unspecified: Secondary | ICD-10-CM | POA: Diagnosis not present

## 2022-04-17 DIAGNOSIS — R5381 Other malaise: Secondary | ICD-10-CM | POA: Diagnosis not present

## 2022-04-17 DIAGNOSIS — G309 Alzheimer's disease, unspecified: Secondary | ICD-10-CM | POA: Diagnosis not present

## 2022-04-17 DIAGNOSIS — E559 Vitamin D deficiency, unspecified: Secondary | ICD-10-CM | POA: Diagnosis not present

## 2022-04-18 DIAGNOSIS — F03918 Unspecified dementia, unspecified severity, with other behavioral disturbance: Secondary | ICD-10-CM | POA: Diagnosis not present

## 2022-04-18 DIAGNOSIS — F59 Unspecified behavioral syndromes associated with physiological disturbances and physical factors: Secondary | ICD-10-CM | POA: Diagnosis not present

## 2022-04-23 DIAGNOSIS — L600 Ingrowing nail: Secondary | ICD-10-CM | POA: Diagnosis not present

## 2022-04-23 DIAGNOSIS — M79671 Pain in right foot: Secondary | ICD-10-CM | POA: Diagnosis not present

## 2022-04-23 DIAGNOSIS — M2041 Other hammer toe(s) (acquired), right foot: Secondary | ICD-10-CM | POA: Diagnosis not present

## 2022-04-23 DIAGNOSIS — M2042 Other hammer toe(s) (acquired), left foot: Secondary | ICD-10-CM | POA: Diagnosis not present

## 2022-04-23 DIAGNOSIS — M2012 Hallux valgus (acquired), left foot: Secondary | ICD-10-CM | POA: Diagnosis not present

## 2022-04-23 DIAGNOSIS — B351 Tinea unguium: Secondary | ICD-10-CM | POA: Diagnosis not present

## 2022-04-23 DIAGNOSIS — M2011 Hallux valgus (acquired), right foot: Secondary | ICD-10-CM | POA: Diagnosis not present

## 2022-04-23 DIAGNOSIS — M79672 Pain in left foot: Secondary | ICD-10-CM | POA: Diagnosis not present

## 2022-05-05 DIAGNOSIS — I10 Essential (primary) hypertension: Secondary | ICD-10-CM | POA: Diagnosis not present

## 2022-05-11 DIAGNOSIS — E119 Type 2 diabetes mellitus without complications: Secondary | ICD-10-CM | POA: Diagnosis not present

## 2022-05-11 DIAGNOSIS — I10 Essential (primary) hypertension: Secondary | ICD-10-CM | POA: Diagnosis not present

## 2022-05-11 DIAGNOSIS — M159 Polyosteoarthritis, unspecified: Secondary | ICD-10-CM | POA: Diagnosis not present

## 2022-05-11 DIAGNOSIS — E038 Other specified hypothyroidism: Secondary | ICD-10-CM | POA: Diagnosis not present

## 2022-05-11 DIAGNOSIS — G309 Alzheimer's disease, unspecified: Secondary | ICD-10-CM | POA: Diagnosis not present

## 2022-05-11 DIAGNOSIS — E559 Vitamin D deficiency, unspecified: Secondary | ICD-10-CM | POA: Diagnosis not present

## 2022-05-11 DIAGNOSIS — E782 Mixed hyperlipidemia: Secondary | ICD-10-CM | POA: Diagnosis not present

## 2022-05-11 DIAGNOSIS — D518 Other vitamin B12 deficiency anemias: Secondary | ICD-10-CM | POA: Diagnosis not present

## 2022-05-18 DIAGNOSIS — F59 Unspecified behavioral syndromes associated with physiological disturbances and physical factors: Secondary | ICD-10-CM | POA: Diagnosis not present

## 2022-05-18 DIAGNOSIS — F03918 Unspecified dementia, unspecified severity, with other behavioral disturbance: Secondary | ICD-10-CM | POA: Diagnosis not present

## 2022-05-22 DIAGNOSIS — R600 Localized edema: Secondary | ICD-10-CM | POA: Diagnosis not present

## 2022-05-22 DIAGNOSIS — M159 Polyosteoarthritis, unspecified: Secondary | ICD-10-CM | POA: Diagnosis not present

## 2022-05-22 DIAGNOSIS — R5381 Other malaise: Secondary | ICD-10-CM | POA: Diagnosis not present

## 2022-05-22 DIAGNOSIS — E559 Vitamin D deficiency, unspecified: Secondary | ICD-10-CM | POA: Diagnosis not present

## 2022-05-22 DIAGNOSIS — I10 Essential (primary) hypertension: Secondary | ICD-10-CM | POA: Diagnosis not present

## 2022-05-22 DIAGNOSIS — G309 Alzheimer's disease, unspecified: Secondary | ICD-10-CM | POA: Diagnosis not present

## 2022-05-22 DIAGNOSIS — S22010D Wedge compression fracture of first thoracic vertebra, subsequent encounter for fracture with routine healing: Secondary | ICD-10-CM | POA: Diagnosis not present

## 2022-06-05 DIAGNOSIS — J449 Chronic obstructive pulmonary disease, unspecified: Secondary | ICD-10-CM | POA: Diagnosis not present

## 2022-06-09 DIAGNOSIS — E038 Other specified hypothyroidism: Secondary | ICD-10-CM | POA: Diagnosis not present

## 2022-06-09 DIAGNOSIS — I10 Essential (primary) hypertension: Secondary | ICD-10-CM | POA: Diagnosis not present

## 2022-06-09 DIAGNOSIS — G309 Alzheimer's disease, unspecified: Secondary | ICD-10-CM | POA: Diagnosis not present

## 2022-06-09 DIAGNOSIS — E782 Mixed hyperlipidemia: Secondary | ICD-10-CM | POA: Diagnosis not present

## 2022-06-09 DIAGNOSIS — E119 Type 2 diabetes mellitus without complications: Secondary | ICD-10-CM | POA: Diagnosis not present

## 2022-06-09 DIAGNOSIS — E559 Vitamin D deficiency, unspecified: Secondary | ICD-10-CM | POA: Diagnosis not present

## 2022-06-09 DIAGNOSIS — M159 Polyosteoarthritis, unspecified: Secondary | ICD-10-CM | POA: Diagnosis not present

## 2022-06-09 DIAGNOSIS — D518 Other vitamin B12 deficiency anemias: Secondary | ICD-10-CM | POA: Diagnosis not present

## 2022-06-14 DIAGNOSIS — N3080 Other cystitis without hematuria: Secondary | ICD-10-CM | POA: Diagnosis not present

## 2022-06-28 DIAGNOSIS — R451 Restlessness and agitation: Secondary | ICD-10-CM | POA: Diagnosis not present

## 2022-06-28 DIAGNOSIS — F03918 Unspecified dementia, unspecified severity, with other behavioral disturbance: Secondary | ICD-10-CM | POA: Diagnosis not present

## 2022-06-28 DIAGNOSIS — F59 Unspecified behavioral syndromes associated with physiological disturbances and physical factors: Secondary | ICD-10-CM | POA: Diagnosis not present

## 2022-06-28 DIAGNOSIS — F419 Anxiety disorder, unspecified: Secondary | ICD-10-CM | POA: Diagnosis not present

## 2022-07-03 DIAGNOSIS — M159 Polyosteoarthritis, unspecified: Secondary | ICD-10-CM | POA: Diagnosis not present

## 2022-07-03 DIAGNOSIS — I10 Essential (primary) hypertension: Secondary | ICD-10-CM | POA: Diagnosis not present

## 2022-07-03 DIAGNOSIS — R600 Localized edema: Secondary | ICD-10-CM | POA: Diagnosis not present

## 2022-07-03 DIAGNOSIS — S22010D Wedge compression fracture of first thoracic vertebra, subsequent encounter for fracture with routine healing: Secondary | ICD-10-CM | POA: Diagnosis not present

## 2022-07-03 DIAGNOSIS — R5381 Other malaise: Secondary | ICD-10-CM | POA: Diagnosis not present

## 2022-07-03 DIAGNOSIS — E559 Vitamin D deficiency, unspecified: Secondary | ICD-10-CM | POA: Diagnosis not present

## 2022-07-06 DIAGNOSIS — J449 Chronic obstructive pulmonary disease, unspecified: Secondary | ICD-10-CM | POA: Diagnosis not present

## 2022-07-16 DIAGNOSIS — Z23 Encounter for immunization: Secondary | ICD-10-CM | POA: Diagnosis not present

## 2022-07-18 DIAGNOSIS — Z961 Presence of intraocular lens: Secondary | ICD-10-CM | POA: Diagnosis not present

## 2022-07-18 DIAGNOSIS — H3530 Unspecified macular degeneration: Secondary | ICD-10-CM | POA: Diagnosis not present

## 2022-07-24 DIAGNOSIS — E782 Mixed hyperlipidemia: Secondary | ICD-10-CM | POA: Diagnosis not present

## 2022-07-24 DIAGNOSIS — D518 Other vitamin B12 deficiency anemias: Secondary | ICD-10-CM | POA: Diagnosis not present

## 2022-07-24 DIAGNOSIS — E038 Other specified hypothyroidism: Secondary | ICD-10-CM | POA: Diagnosis not present

## 2022-07-24 DIAGNOSIS — E119 Type 2 diabetes mellitus without complications: Secondary | ICD-10-CM | POA: Diagnosis not present

## 2022-07-24 DIAGNOSIS — E7849 Other hyperlipidemia: Secondary | ICD-10-CM | POA: Diagnosis not present

## 2022-07-24 DIAGNOSIS — Z79899 Other long term (current) drug therapy: Secondary | ICD-10-CM | POA: Diagnosis not present

## 2022-07-25 DIAGNOSIS — B351 Tinea unguium: Secondary | ICD-10-CM | POA: Diagnosis not present

## 2022-07-25 DIAGNOSIS — M79672 Pain in left foot: Secondary | ICD-10-CM | POA: Diagnosis not present

## 2022-07-25 DIAGNOSIS — L600 Ingrowing nail: Secondary | ICD-10-CM | POA: Diagnosis not present

## 2022-07-25 DIAGNOSIS — M79671 Pain in right foot: Secondary | ICD-10-CM | POA: Diagnosis not present

## 2022-07-28 ENCOUNTER — Emergency Department
Admission: EM | Admit: 2022-07-28 | Discharge: 2022-07-28 | Disposition: A | Discharge status: Home / Self Care | Payer: Medicare Other | Attending: Emergency Medicine | Admitting: Emergency Medicine

## 2022-07-28 ENCOUNTER — Other Ambulatory Visit: Payer: Self-pay

## 2022-07-28 ENCOUNTER — Emergency Department: Payer: Medicare Other

## 2022-07-28 ENCOUNTER — Encounter: Payer: Self-pay | Admitting: Emergency Medicine

## 2022-07-28 DIAGNOSIS — R1111 Vomiting without nausea: Secondary | ICD-10-CM | POA: Diagnosis not present

## 2022-07-28 DIAGNOSIS — F0390 Unspecified dementia without behavioral disturbance: Secondary | ICD-10-CM | POA: Insufficient documentation

## 2022-07-28 DIAGNOSIS — R197 Diarrhea, unspecified: Secondary | ICD-10-CM | POA: Diagnosis not present

## 2022-07-28 DIAGNOSIS — N390 Urinary tract infection, site not specified: Secondary | ICD-10-CM

## 2022-07-28 DIAGNOSIS — R9431 Abnormal electrocardiogram [ECG] [EKG]: Secondary | ICD-10-CM | POA: Diagnosis not present

## 2022-07-28 DIAGNOSIS — R4182 Altered mental status, unspecified: Secondary | ICD-10-CM | POA: Diagnosis not present

## 2022-07-28 DIAGNOSIS — F918 Other conduct disorders: Secondary | ICD-10-CM | POA: Diagnosis not present

## 2022-07-28 DIAGNOSIS — Z743 Need for continuous supervision: Secondary | ICD-10-CM | POA: Diagnosis not present

## 2022-07-28 DIAGNOSIS — I10 Essential (primary) hypertension: Secondary | ICD-10-CM | POA: Diagnosis not present

## 2022-07-28 LAB — CBC WITH DIFFERENTIAL/PLATELET
Abs Immature Granulocytes: 0.04 10*3/uL (ref 0.00–0.07)
Basophils Absolute: 0 10*3/uL (ref 0.0–0.1)
Basophils Relative: 1 %
Eosinophils Absolute: 0.1 10*3/uL (ref 0.0–0.5)
Eosinophils Relative: 1 %
HCT: 37.3 % (ref 36.0–46.0)
Hemoglobin: 11.7 g/dL — ABNORMAL LOW (ref 12.0–15.0)
Immature Granulocytes: 1 %
Lymphocytes Relative: 26 %
Lymphs Abs: 2.1 10*3/uL (ref 0.7–4.0)
MCH: 30.8 pg (ref 26.0–34.0)
MCHC: 31.4 g/dL (ref 30.0–36.0)
MCV: 98.2 fL (ref 80.0–100.0)
Monocytes Absolute: 0.9 10*3/uL (ref 0.1–1.0)
Monocytes Relative: 11 %
Neutro Abs: 5 10*3/uL (ref 1.7–7.7)
Neutrophils Relative %: 60 %
Platelets: 266 10*3/uL (ref 150–400)
RBC: 3.8 MIL/uL — ABNORMAL LOW (ref 3.87–5.11)
RDW: 13.2 % (ref 11.5–15.5)
WBC: 8.1 10*3/uL (ref 4.0–10.5)
nRBC: 0 % (ref 0.0–0.2)

## 2022-07-28 LAB — URINALYSIS, ROUTINE W REFLEX MICROSCOPIC
Bilirubin Urine: NEGATIVE
Glucose, UA: NEGATIVE mg/dL
Hgb urine dipstick: NEGATIVE
Ketones, ur: NEGATIVE mg/dL
Nitrite: POSITIVE — AB
Protein, ur: NEGATIVE mg/dL
Specific Gravity, Urine: 1.012 (ref 1.005–1.030)
WBC, UA: >50 WBC/hpf — ABNORMAL HIGH (ref 0–5)
pH: 8 (ref 5.0–8.0)

## 2022-07-28 LAB — COMPREHENSIVE METABOLIC PANEL
ALT: 10 U/L (ref 0–44)
AST: 19 U/L (ref 15–41)
Albumin: 3.8 g/dL (ref 3.5–5.0)
Alkaline Phosphatase: 102 U/L (ref 38–126)
Anion gap: 9 (ref 5–15)
BUN: 19 mg/dL (ref 8–23)
CO2: 28 mmol/L (ref 22–32)
Calcium: 9.2 mg/dL (ref 8.9–10.3)
Chloride: 105 mmol/L (ref 98–111)
Creatinine, Ser: 0.78 mg/dL (ref 0.44–1.00)
GFR, Estimated: >60 mL/min (ref >60)
Glucose, Bld: 104 mg/dL — ABNORMAL HIGH (ref 70–99)
Potassium: 3.8 mmol/L (ref 3.5–5.1)
Sodium: 142 mmol/L (ref 135–145)
Total Bilirubin: 0.9 mg/dL (ref 0.3–1.2)
Total Protein: 7.6 g/dL (ref 6.5–8.1)

## 2022-07-28 LAB — LACTIC ACID, PLASMA: Lactic Acid, Venous: 1.3 mmol/L (ref 0.5–1.9)

## 2022-07-28 MED ORDER — SODIUM CHLORIDE 0.9 % IV SOLN
1.0000 g | Freq: Once | INTRAVENOUS | Status: AC
  Administered 2022-07-28: 1 g via INTRAVENOUS
  Filled 2022-07-28: qty 10

## 2022-07-28 MED ORDER — CEFDINIR 300 MG PO CAPS: 300.0000 mg | ORAL_CAPSULE | Freq: Two times a day (BID) | ORAL | 0 refills | Status: AC

## 2022-07-28 MED ORDER — LACTATED RINGERS IV BOLUS
500.0000 mL | Freq: Once | INTRAVENOUS | Status: AC
  Administered 2022-07-28: 500 mL via INTRAVENOUS

## 2022-07-28 NOTE — ED Notes (Signed)
Patient tolerated in and out cath well. Clear, light-yellow urine noted. Patient is alert, cooperative, answers  appropriately with one-word answers.

## 2022-07-28 NOTE — ED Provider Notes (Signed)
[facility] Provider Note    Event Date/Time   First MD Initiated Contact with Patient 07/28/22 1122     (approximate)   History   Altered Mental Status   HPI  Brandi Chang is a 88 y.o. female past medical history of dementia who presents with altered mental status.  Patient is accompanied by her son and his wife.  Notes that the facility called them today said that she was less responsive and had a fever.  They stated this was the first day that she had had any change in mental status although triage notes that EMS had said that staff reported change in mental status for 1 week.  They note that at baseline she is not typically know where she is they do think she is able to ambulate but she does have some days where she is more responsive than others.  Currently they note that she seems to be at her baseline.  The facility noted that her urine smelled very strong.  History is somewhat limited from the patient due to her dementia but she denies any pain currently.  Does say that her pants are currently wet.  Patient has had UTIs in the past last was treated 4 months ago.  No recent antibiotics.     Past Medical History:  Diagnosis Date   Arthritis    Dementia [facility])     Patient Active Problem List   Diagnosis Date Noted   UTI (urinary tract infection) 01/30/2022   AMS (altered mental status) 01/30/2022   Altered mental status 01/29/2022   DNR (do not resuscitate) discussion    Dementia (HCC) 05/29/2017   Loss of memory 05/29/2017   Mood disorder (HCC) 05/29/2017   Age-related osteoporosis with current pathological fracture with routine healing 05/02/2017   Encephalopathy acute 05/02/2017   Bimalleolar ankle fracture 04/28/2017     Physical Exam  Triage Vital Signs: ED Triage Vitals [07/28/22 0841]  Enc Vitals Group     BP (!) 168/64     Pulse Rate 69     Resp 20     Temp 97.9 F (36.6 C)     Temp Source Oral     SpO2 99 %     Weight 160  lb 0.9 oz (72.6 kg)     Height 5\' 6"  (1.676 m)     Head Circumference      Peak Flow      Pain Score      Pain Loc      Pain Edu?      Excl. in GC?     Most recent vital signs: Vitals:   07/28/22 0841 07/28/22 1149  BP: (!) 168/64 (!) 163/54  Pulse: 69 72  Resp: 20 16  Temp: 97.9 F (36.6 C) 97.8 F (36.6 C)  SpO2: 99% 98%     General: Awake, no distress.  CV:  Good peripheral perfusion.  Trace pitting edema bilateral lower extremities Resp:  Normal effort.  No increased work of breathing Abd:  No distention.  Abdomen is soft and nontender Neuro:             Awake, Alert, patient's eyes are open she does follow commands equal grip strength bilateral upper extremities and able to lift both legs symmetrically in the lower extremities, face is symmetric normal tongue movement she has a paucity of speech does not answer orientation questions but does answer yes and no to questions about her symptoms Other:  ED Results / Procedures / Treatments  Labs (all labs ordered are listed, but only abnormal results are displayed) Labs Reviewed  COMPREHENSIVE METABOLIC PANEL - Abnormal; Notable for the following components:      Result Value   Glucose, Bld 104 (*)    All other components within normal limits  CBC WITH DIFFERENTIAL/PLATELET - Abnormal; Notable for the following components:   RBC 3.80 (*)    Hemoglobin 11.7 (*)    All other components within normal limits  URINALYSIS, ROUTINE W REFLEX MICROSCOPIC - Abnormal; Notable for the following components:   Color, Urine YELLOW (*)    APPearance CLOUDY (*)    Nitrite POSITIVE (*)    Leukocytes,Ua MODERATE (*)    WBC, UA >50 (*)    Bacteria, UA FEW (*)    All other components within normal limits  URINE CULTURE  LACTIC ACID, PLASMA     EKG  EKG interpretation performed by myself: NSR, nml axis, nml intervals, no acute ischemic changes    RADIOLOGY I reviewed and interpreted the CXR which does not show any acute  cardiopulmonary process    PROCEDURES:  Critical Care performed: No  Procedures    MEDICATIONS ORDERED IN ED: Medications  lactated ringers bolus 500 mL (0 mLs Intravenous Stopped 07/28/22 1335)  cefTRIAXone (ROCEPHIN) 1 g in sodium chloride 0.9 % 100 mL IVPB (0 g Intravenous Stopped 07/28/22 1432)     IMPRESSION / MDM / ASSESSMENT AND PLAN / ED COURSE  I reviewed the triage vital signs and the nursing notes.                              Patient's presentation is most consistent with acute presentation with potential threat to life or bodily function.  Differential diagnosis includes, but is not limited to, UTI, pneumonia, viral illness, CVA, metabolic abnormality, hypovolemia, renal failure  The patient is an 88 year old female with underlying dementia who presents because of altered mental status.  History from triage note and from patient's family who is at bedside is not entirely consistent.  Family tells me that the facility called them today noted that she was acutely off of her baseline and had a fever but triage note says that she has had altered mental status for 1 week and there is no mention of fever.  Staff did report dark urine but patient has not had any recent antibiotics was treated for UTI last 4 months ago and has had altered mental status in the setting of UTI in the past.  Patient is afebrile here mildly hypertensive but vitals are otherwise reassuring.  She is awake and alert follows commands but does not have much speech answers yes/no questions but does not answer orientation questions.  She denies any pain currently abdomen is soft nontender her exam is nonfocal.  Labs including CBC CMP lactate are reassuring chest x-ray is clear EKG nonischemic.  Patient will need urinalysis as I could do suspect she could have a UTI.  However does not seem to be far off her baseline based on family's report can likely be treated as an outpatient.  Patient's UA is positive.   Urine culture has been sent.  Patient received first dose of Rocephin in the emergency department as well as a liter of fluid.  She continued to deny any ongoing complaints.  Patient's family again notes that she is still at her baseline.  Will discharge with 7 days  of cefdinir.  Discussed return precautions with the patient's family.       FINAL CLINICAL IMPRESSION(S) / ED DIAGNOSES   Final diagnoses:  Urinary tract infection without hematuria, site unspecified     Rx / DC Orders   ED Discharge Orders          Ordered    cefdinir (OMNICEF) 300 MG capsule  2 times daily        07/28/22 1455             Note:  This document was prepared using Dragon voice recognition software and may include unintentional dictation errors.   Rada Hay, MD 07/28/22 [phone]

## 2022-07-28 NOTE — ED Notes (Signed)
Attempted to call [facility] was contacted 3 times without an answer.

## 2022-07-28 NOTE — Discharge Instructions (Addendum)
Your blood work was reassuring but it does look like you have a urinary tract infection which is likely why you are more weak today.  You are given the first dose of antibiotics through the IV.  Please take the antibiotic twice a day for the next 7 days for treatment.  If you are worsening, develop abdominal pain or have persistent fevers despite being on antibiotics please return to the emergency department.

## 2022-07-28 NOTE — ED Triage Notes (Signed)
ARrives via [facility] from [place].  EMS called for mental status change.  Per report, patient normally able to talk and maintain conversation.  Has history of dementia.  Staff report change in mental status has been ongoing x 1 week.  Patient has strong smelling urine and dark urine, recent UTI treated with Antibiotics.  CBG:  133.  18g RFA - 500 LR infused.  VS wnl.

## 2022-07-29 DIAGNOSIS — E038 Other specified hypothyroidism: Secondary | ICD-10-CM | POA: Diagnosis not present

## 2022-07-29 DIAGNOSIS — D518 Other vitamin B12 deficiency anemias: Secondary | ICD-10-CM | POA: Diagnosis not present

## 2022-07-29 DIAGNOSIS — E119 Type 2 diabetes mellitus without complications: Secondary | ICD-10-CM | POA: Diagnosis not present

## 2022-07-29 DIAGNOSIS — I10 Essential (primary) hypertension: Secondary | ICD-10-CM | POA: Diagnosis not present

## 2022-07-29 DIAGNOSIS — E559 Vitamin D deficiency, unspecified: Secondary | ICD-10-CM | POA: Diagnosis not present

## 2022-07-29 DIAGNOSIS — M159 Polyosteoarthritis, unspecified: Secondary | ICD-10-CM | POA: Diagnosis not present

## 2022-07-29 DIAGNOSIS — E782 Mixed hyperlipidemia: Secondary | ICD-10-CM | POA: Diagnosis not present

## 2022-07-29 DIAGNOSIS — G309 Alzheimer's disease, unspecified: Secondary | ICD-10-CM | POA: Diagnosis not present

## 2022-07-30 LAB — URINE CULTURE: Culture: >=100000 — AB

## 2022-07-31 DIAGNOSIS — R5381 Other malaise: Secondary | ICD-10-CM | POA: Diagnosis not present

## 2022-07-31 DIAGNOSIS — R600 Localized edema: Secondary | ICD-10-CM | POA: Diagnosis not present

## 2022-07-31 DIAGNOSIS — I10 Essential (primary) hypertension: Secondary | ICD-10-CM | POA: Diagnosis not present

## 2022-07-31 DIAGNOSIS — N390 Urinary tract infection, site not specified: Secondary | ICD-10-CM | POA: Diagnosis not present

## 2022-07-31 DIAGNOSIS — M159 Polyosteoarthritis, unspecified: Secondary | ICD-10-CM | POA: Diagnosis not present

## 2022-07-31 DIAGNOSIS — E559 Vitamin D deficiency, unspecified: Secondary | ICD-10-CM | POA: Diagnosis not present

## 2022-08-01 DIAGNOSIS — F59 Unspecified behavioral syndromes associated with physiological disturbances and physical factors: Secondary | ICD-10-CM | POA: Diagnosis not present

## 2022-08-01 DIAGNOSIS — F419 Anxiety disorder, unspecified: Secondary | ICD-10-CM | POA: Diagnosis not present

## 2022-08-01 DIAGNOSIS — F03918 Unspecified dementia, unspecified severity, with other behavioral disturbance: Secondary | ICD-10-CM | POA: Diagnosis not present

## 2022-08-01 DIAGNOSIS — R451 Restlessness and agitation: Secondary | ICD-10-CM | POA: Diagnosis not present

## 2022-08-05 DIAGNOSIS — J449 Chronic obstructive pulmonary disease, unspecified: Secondary | ICD-10-CM | POA: Diagnosis not present

## 2022-08-07 DIAGNOSIS — R011 Cardiac murmur, unspecified: Secondary | ICD-10-CM | POA: Diagnosis not present

## 2022-08-09 DIAGNOSIS — F419 Anxiety disorder, unspecified: Secondary | ICD-10-CM | POA: Diagnosis not present

## 2022-08-09 DIAGNOSIS — F03918 Unspecified dementia, unspecified severity, with other behavioral disturbance: Secondary | ICD-10-CM | POA: Diagnosis not present

## 2022-08-09 DIAGNOSIS — R0989 Other specified symptoms and signs involving the circulatory and respiratory systems: Secondary | ICD-10-CM | POA: Diagnosis not present

## 2022-08-09 DIAGNOSIS — F03911 Unspecified dementia, unspecified severity, with agitation: Secondary | ICD-10-CM | POA: Diagnosis not present

## 2022-08-09 DIAGNOSIS — I6523 Occlusion and stenosis of bilateral carotid arteries: Secondary | ICD-10-CM | POA: Diagnosis not present

## 2022-08-14 DIAGNOSIS — I10 Essential (primary) hypertension: Secondary | ICD-10-CM | POA: Diagnosis not present

## 2022-08-14 DIAGNOSIS — M159 Polyosteoarthritis, unspecified: Secondary | ICD-10-CM | POA: Diagnosis not present

## 2022-08-14 DIAGNOSIS — G309 Alzheimer's disease, unspecified: Secondary | ICD-10-CM | POA: Diagnosis not present

## 2022-08-14 DIAGNOSIS — R600 Localized edema: Secondary | ICD-10-CM | POA: Diagnosis not present

## 2022-08-14 DIAGNOSIS — R5381 Other malaise: Secondary | ICD-10-CM | POA: Diagnosis not present

## 2022-08-14 DIAGNOSIS — E559 Vitamin D deficiency, unspecified: Secondary | ICD-10-CM | POA: Diagnosis not present

## 2022-08-16 DIAGNOSIS — E559 Vitamin D deficiency, unspecified: Secondary | ICD-10-CM | POA: Diagnosis not present

## 2022-08-16 DIAGNOSIS — E038 Other specified hypothyroidism: Secondary | ICD-10-CM | POA: Diagnosis not present

## 2022-08-16 DIAGNOSIS — G309 Alzheimer's disease, unspecified: Secondary | ICD-10-CM | POA: Diagnosis not present

## 2022-08-16 DIAGNOSIS — I77811 Abdominal aortic ectasia: Secondary | ICD-10-CM | POA: Diagnosis not present

## 2022-08-16 DIAGNOSIS — D518 Other vitamin B12 deficiency anemias: Secondary | ICD-10-CM | POA: Diagnosis not present

## 2022-08-16 DIAGNOSIS — E119 Type 2 diabetes mellitus without complications: Secondary | ICD-10-CM | POA: Diagnosis not present

## 2022-08-16 DIAGNOSIS — I10 Essential (primary) hypertension: Secondary | ICD-10-CM | POA: Diagnosis not present

## 2022-08-16 DIAGNOSIS — M159 Polyosteoarthritis, unspecified: Secondary | ICD-10-CM | POA: Diagnosis not present

## 2022-08-16 DIAGNOSIS — E782 Mixed hyperlipidemia: Secondary | ICD-10-CM | POA: Diagnosis not present

## 2022-08-20 DIAGNOSIS — R590 Localized enlarged lymph nodes: Secondary | ICD-10-CM | POA: Diagnosis not present

## 2022-08-22 DIAGNOSIS — I70223 Atherosclerosis of native arteries of extremities with rest pain, bilateral legs: Secondary | ICD-10-CM | POA: Diagnosis not present

## 2022-08-24 DIAGNOSIS — E119 Type 2 diabetes mellitus without complications: Secondary | ICD-10-CM | POA: Diagnosis not present

## 2022-08-24 DIAGNOSIS — E782 Mixed hyperlipidemia: Secondary | ICD-10-CM | POA: Diagnosis not present

## 2022-08-24 DIAGNOSIS — Z79899 Other long term (current) drug therapy: Secondary | ICD-10-CM | POA: Diagnosis not present

## 2022-08-24 DIAGNOSIS — D518 Other vitamin B12 deficiency anemias: Secondary | ICD-10-CM | POA: Diagnosis not present

## 2022-08-28 DIAGNOSIS — E559 Vitamin D deficiency, unspecified: Secondary | ICD-10-CM | POA: Diagnosis not present

## 2022-08-28 DIAGNOSIS — R600 Localized edema: Secondary | ICD-10-CM | POA: Diagnosis not present

## 2022-08-28 DIAGNOSIS — I10 Essential (primary) hypertension: Secondary | ICD-10-CM | POA: Diagnosis not present

## 2022-08-28 DIAGNOSIS — G309 Alzheimer's disease, unspecified: Secondary | ICD-10-CM | POA: Diagnosis not present

## 2022-08-28 DIAGNOSIS — S22010D Wedge compression fracture of first thoracic vertebra, subsequent encounter for fracture with routine healing: Secondary | ICD-10-CM | POA: Diagnosis not present

## 2022-08-28 DIAGNOSIS — M159 Polyosteoarthritis, unspecified: Secondary | ICD-10-CM | POA: Diagnosis not present

## 2022-08-29 DIAGNOSIS — R451 Restlessness and agitation: Secondary | ICD-10-CM | POA: Diagnosis not present

## 2022-08-29 DIAGNOSIS — F03918 Unspecified dementia, unspecified severity, with other behavioral disturbance: Secondary | ICD-10-CM | POA: Diagnosis not present

## 2022-08-29 DIAGNOSIS — F419 Anxiety disorder, unspecified: Secondary | ICD-10-CM | POA: Diagnosis not present

## 2022-08-29 DIAGNOSIS — F59 Unspecified behavioral syndromes associated with physiological disturbances and physical factors: Secondary | ICD-10-CM | POA: Diagnosis not present

## 2022-08-29 DIAGNOSIS — F03911 Unspecified dementia, unspecified severity, with agitation: Secondary | ICD-10-CM | POA: Diagnosis not present

## 2022-09-05 DIAGNOSIS — J449 Chronic obstructive pulmonary disease, unspecified: Secondary | ICD-10-CM | POA: Diagnosis not present

## 2022-09-06 DIAGNOSIS — R2241 Localized swelling, mass and lump, right lower limb: Secondary | ICD-10-CM | POA: Diagnosis not present

## 2022-09-14 DIAGNOSIS — R221 Localized swelling, mass and lump, neck: Secondary | ICD-10-CM | POA: Diagnosis not present

## 2022-09-14 DIAGNOSIS — E042 Nontoxic multinodular goiter: Secondary | ICD-10-CM | POA: Diagnosis not present

## 2022-09-19 DIAGNOSIS — F419 Anxiety disorder, unspecified: Secondary | ICD-10-CM | POA: Diagnosis not present

## 2022-09-19 DIAGNOSIS — F59 Unspecified behavioral syndromes associated with physiological disturbances and physical factors: Secondary | ICD-10-CM | POA: Diagnosis not present

## 2022-09-19 DIAGNOSIS — F03918 Unspecified dementia, unspecified severity, with other behavioral disturbance: Secondary | ICD-10-CM | POA: Diagnosis not present

## 2022-09-19 DIAGNOSIS — R451 Restlessness and agitation: Secondary | ICD-10-CM | POA: Diagnosis not present

## 2022-09-19 DIAGNOSIS — F03911 Unspecified dementia, unspecified severity, with agitation: Secondary | ICD-10-CM | POA: Diagnosis not present

## 2022-09-24 DIAGNOSIS — Z23 Encounter for immunization: Secondary | ICD-10-CM | POA: Diagnosis not present

## 2022-09-25 DIAGNOSIS — R600 Localized edema: Secondary | ICD-10-CM | POA: Diagnosis not present

## 2022-09-25 DIAGNOSIS — R5381 Other malaise: Secondary | ICD-10-CM | POA: Diagnosis not present

## 2022-09-25 DIAGNOSIS — I10 Essential (primary) hypertension: Secondary | ICD-10-CM | POA: Diagnosis not present

## 2022-09-25 DIAGNOSIS — M159 Polyosteoarthritis, unspecified: Secondary | ICD-10-CM | POA: Diagnosis not present

## 2022-09-25 DIAGNOSIS — E559 Vitamin D deficiency, unspecified: Secondary | ICD-10-CM | POA: Diagnosis not present

## 2022-09-25 DIAGNOSIS — S22010D Wedge compression fracture of first thoracic vertebra, subsequent encounter for fracture with routine healing: Secondary | ICD-10-CM | POA: Diagnosis not present

## 2022-10-01 DIAGNOSIS — D518 Other vitamin B12 deficiency anemias: Secondary | ICD-10-CM | POA: Diagnosis not present

## 2022-10-01 DIAGNOSIS — E782 Mixed hyperlipidemia: Secondary | ICD-10-CM | POA: Diagnosis not present

## 2022-10-01 DIAGNOSIS — I10 Essential (primary) hypertension: Secondary | ICD-10-CM | POA: Diagnosis not present

## 2022-10-01 DIAGNOSIS — G309 Alzheimer's disease, unspecified: Secondary | ICD-10-CM | POA: Diagnosis not present

## 2022-10-01 DIAGNOSIS — E038 Other specified hypothyroidism: Secondary | ICD-10-CM | POA: Diagnosis not present

## 2022-10-01 DIAGNOSIS — E559 Vitamin D deficiency, unspecified: Secondary | ICD-10-CM | POA: Diagnosis not present

## 2022-10-01 DIAGNOSIS — M159 Polyosteoarthritis, unspecified: Secondary | ICD-10-CM | POA: Diagnosis not present

## 2022-10-01 DIAGNOSIS — E119 Type 2 diabetes mellitus without complications: Secondary | ICD-10-CM | POA: Diagnosis not present

## 2022-10-05 DIAGNOSIS — J449 Chronic obstructive pulmonary disease, unspecified: Secondary | ICD-10-CM | POA: Diagnosis not present

## 2022-12-07 DEATH — deceased
